# Patient Record
Sex: Female | Born: 1945 | Race: White | Hispanic: No | Marital: Married | Smoking: Never smoker
Health system: Southern US, Community
[De-identification: ages and names within clinical notes are randomized; demographics above are authoritative.]

## PROBLEM LIST (undated history)

## (undated) DIAGNOSIS — M1990 Unspecified osteoarthritis, unspecified site: Secondary | ICD-10-CM

## (undated) DIAGNOSIS — R7301 Impaired fasting glucose: Secondary | ICD-10-CM

## (undated) DIAGNOSIS — Z8619 Personal history of other infectious and parasitic diseases: Secondary | ICD-10-CM

## (undated) DIAGNOSIS — B059 Measles without complication: Secondary | ICD-10-CM

## (undated) DIAGNOSIS — M8580 Other specified disorders of bone density and structure, unspecified site: Secondary | ICD-10-CM

## (undated) DIAGNOSIS — H269 Unspecified cataract: Secondary | ICD-10-CM

## (undated) DIAGNOSIS — R011 Cardiac murmur, unspecified: Secondary | ICD-10-CM

## (undated) DIAGNOSIS — K5730 Diverticulosis of large intestine without perforation or abscess without bleeding: Secondary | ICD-10-CM

## (undated) DIAGNOSIS — Z8719 Personal history of other diseases of the digestive system: Secondary | ICD-10-CM

## (undated) DIAGNOSIS — Z8679 Personal history of other diseases of the circulatory system: Secondary | ICD-10-CM

## (undated) DIAGNOSIS — H35342 Macular cyst, hole, or pseudohole, left eye: Secondary | ICD-10-CM

## (undated) DIAGNOSIS — C801 Malignant (primary) neoplasm, unspecified: Secondary | ICD-10-CM

## (undated) DIAGNOSIS — E559 Vitamin D deficiency, unspecified: Secondary | ICD-10-CM

## (undated) DIAGNOSIS — K5792 Diverticulitis of intestine, part unspecified, without perforation or abscess without bleeding: Secondary | ICD-10-CM

## (undated) DIAGNOSIS — T7840XA Allergy, unspecified, initial encounter: Secondary | ICD-10-CM

## (undated) DIAGNOSIS — R351 Nocturia: Secondary | ICD-10-CM

## (undated) DIAGNOSIS — B269 Mumps without complication: Secondary | ICD-10-CM

## (undated) DIAGNOSIS — E785 Hyperlipidemia, unspecified: Secondary | ICD-10-CM

## (undated) HISTORY — DX: Cardiac murmur, unspecified: R01.1

## (undated) HISTORY — PX: REPLACEMENT TOTAL KNEE: SUR1224

## (undated) HISTORY — DX: Diverticulitis of intestine, part unspecified, without perforation or abscess without bleeding: K57.92

## (undated) HISTORY — PX: BREAST CYST ASPIRATION: SHX578

## (undated) HISTORY — DX: Diverticulosis of large intestine without perforation or abscess without bleeding: K57.30

## (undated) HISTORY — DX: Other specified disorders of bone density and structure, unspecified site: M85.80

## (undated) HISTORY — PX: EYE SURGERY: SHX253

## (undated) HISTORY — DX: Unspecified cataract: H26.9

## (undated) HISTORY — DX: Macular cyst, hole, or pseudohole, left eye: H35.342

## (undated) HISTORY — PX: CATARACT EXTRACTION: SUR2

## (undated) HISTORY — PX: APPENDECTOMY: SHX54

## (undated) HISTORY — PX: BREAST BIOPSY: SHX20

## (undated) HISTORY — DX: Impaired fasting glucose: R73.01

## (undated) HISTORY — DX: Personal history of other diseases of the digestive system: Z87.19

## (undated) HISTORY — DX: Allergy, unspecified, initial encounter: T78.40XA

## (undated) HISTORY — DX: Mumps without complication: B26.9

## (undated) HISTORY — PX: RIGHT OOPHORECTOMY: SHX2359

## (undated) HISTORY — DX: Personal history of other infectious and parasitic diseases: Z86.19

## (undated) HISTORY — DX: Nocturia: R35.1

## (undated) HISTORY — DX: Unspecified osteoarthritis, unspecified site: M19.90

## (undated) HISTORY — DX: Hyperlipidemia, unspecified: E78.5

## (undated) HISTORY — DX: Personal history of other diseases of the circulatory system: Z86.79

## (undated) HISTORY — DX: Vitamin D deficiency, unspecified: E55.9

## (undated) HISTORY — DX: Measles without complication: B05.9

---

## 2006-07-02 LAB — HM COLONOSCOPY: HM Colonoscopy: NORMAL

## 2008-07-02 HISTORY — PX: COLON SURGERY: SHX602

## 2013-11-30 LAB — HM MAMMOGRAPHY: HM MAMMO: NORMAL

## 2014-04-14 ENCOUNTER — Telehealth: Payer: Self-pay | Admitting: Family Medicine

## 2014-04-14 NOTE — Telephone Encounter (Signed)
Rec'd from [place] forward 102 pages to Dr.Byth

## 2014-04-16 ENCOUNTER — Telehealth: Payer: Self-pay | Admitting: *Deleted

## 2014-04-16 NOTE — Telephone Encounter (Signed)
Records received and forwarded to Dr. Charlett Blake. JG//CMA

## 2014-04-19 NOTE — Telephone Encounter (Signed)
Error

## 2014-07-02 HISTORY — PX: REPLACEMENT TOTAL KNEE: SUR1224

## 2014-08-02 ENCOUNTER — Ambulatory Visit: Payer: Medicare HMO | Attending: Orthopedic Surgery | Admitting: Rehabilitation

## 2014-08-02 DIAGNOSIS — Z96653 Presence of artificial knee joint, bilateral: Secondary | ICD-10-CM | POA: Diagnosis not present

## 2014-08-02 DIAGNOSIS — R262 Difficulty in walking, not elsewhere classified: Secondary | ICD-10-CM | POA: Diagnosis not present

## 2014-08-02 DIAGNOSIS — M25661 Stiffness of right knee, not elsewhere classified: Secondary | ICD-10-CM | POA: Diagnosis not present

## 2014-08-02 DIAGNOSIS — M25561 Pain in right knee: Secondary | ICD-10-CM | POA: Insufficient documentation

## 2014-08-04 ENCOUNTER — Ambulatory Visit: Payer: Medicare HMO | Admitting: Rehabilitation

## 2014-08-06 ENCOUNTER — Ambulatory Visit: Payer: Medicare HMO | Admitting: Rehabilitation

## 2014-08-09 ENCOUNTER — Ambulatory Visit: Payer: Medicare HMO | Admitting: Rehabilitation

## 2014-08-11 ENCOUNTER — Ambulatory Visit: Payer: Medicare HMO | Admitting: Rehabilitation

## 2014-08-11 DIAGNOSIS — M25561 Pain in right knee: Secondary | ICD-10-CM | POA: Diagnosis not present

## 2014-08-12 ENCOUNTER — Ambulatory Visit: Payer: Medicare HMO | Admitting: Rehabilitation

## 2014-08-12 DIAGNOSIS — M25561 Pain in right knee: Secondary | ICD-10-CM | POA: Diagnosis not present

## 2014-08-16 ENCOUNTER — Ambulatory Visit: Payer: Medicare HMO | Admitting: Rehabilitation

## 2014-08-19 ENCOUNTER — Ambulatory Visit: Payer: Medicare HMO | Admitting: Rehabilitation

## 2014-08-23 ENCOUNTER — Ambulatory Visit: Payer: Medicare HMO | Admitting: Rehabilitation

## 2014-08-26 ENCOUNTER — Ambulatory Visit: Payer: Self-pay | Admitting: Family Medicine

## 2014-08-26 ENCOUNTER — Ambulatory Visit: Payer: Medicare HMO | Admitting: Rehabilitation

## 2014-12-08 ENCOUNTER — Telehealth: Payer: Self-pay | Admitting: *Deleted

## 2014-12-08 ENCOUNTER — Encounter: Payer: Self-pay | Admitting: *Deleted

## 2014-12-08 NOTE — Telephone Encounter (Signed)
Pre-Visit Call completed with patient and chart updated.   Pre-Visit Info documented in Specialty Comments under [employer].    

## 2014-12-09 ENCOUNTER — Encounter: Payer: Self-pay | Admitting: Family Medicine

## 2014-12-09 ENCOUNTER — Ambulatory Visit (INDEPENDENT_AMBULATORY_CARE_PROVIDER_SITE_OTHER): Payer: Medicare HMO | Admitting: Family Medicine

## 2014-12-09 VITALS — BP 102/70 | HR 86 | Temp 97.8°F | Ht 62.0 in | Wt 129.2 lb

## 2014-12-09 DIAGNOSIS — K5730 Diverticulosis of large intestine without perforation or abscess without bleeding: Secondary | ICD-10-CM | POA: Diagnosis not present

## 2014-12-09 DIAGNOSIS — R7303 Prediabetes: Secondary | ICD-10-CM | POA: Insufficient documentation

## 2014-12-09 DIAGNOSIS — E559 Vitamin D deficiency, unspecified: Secondary | ICD-10-CM

## 2014-12-09 DIAGNOSIS — M1990 Unspecified osteoarthritis, unspecified site: Secondary | ICD-10-CM

## 2014-12-09 DIAGNOSIS — Z8619 Personal history of other infectious and parasitic diseases: Secondary | ICD-10-CM | POA: Insufficient documentation

## 2014-12-09 DIAGNOSIS — Z0000 Encounter for general adult medical examination without abnormal findings: Secondary | ICD-10-CM

## 2014-12-09 DIAGNOSIS — H35342 Macular cyst, hole, or pseudohole, left eye: Secondary | ICD-10-CM

## 2014-12-09 DIAGNOSIS — T7840XA Allergy, unspecified, initial encounter: Secondary | ICD-10-CM | POA: Insufficient documentation

## 2014-12-09 DIAGNOSIS — E785 Hyperlipidemia, unspecified: Secondary | ICD-10-CM

## 2014-12-09 DIAGNOSIS — R7301 Impaired fasting glucose: Secondary | ICD-10-CM

## 2014-12-09 DIAGNOSIS — R351 Nocturia: Secondary | ICD-10-CM | POA: Diagnosis not present

## 2014-12-09 DIAGNOSIS — Z8719 Personal history of other diseases of the digestive system: Secondary | ICD-10-CM

## 2014-12-09 HISTORY — DX: Impaired fasting glucose: R73.01

## 2014-12-09 HISTORY — DX: Personal history of other diseases of the digestive system: Z87.19

## 2014-12-09 HISTORY — DX: Diverticulosis of large intestine without perforation or abscess without bleeding: K57.30

## 2014-12-09 HISTORY — DX: Unspecified osteoarthritis, unspecified site: M19.90

## 2014-12-09 HISTORY — DX: Nocturia: R35.1

## 2014-12-09 NOTE — Progress Notes (Signed)
Pre visit review using our clinic review tool, if applicable. No additional management support is needed unless otherwise documented below in the visit note. 

## 2014-12-09 NOTE — Progress Notes (Signed)
Moraima Burd  [id number] 1945/09/18 12/09/2014      Progress Note-Follow Up  Subjective  Chief Complaint  Chief Complaint  Patient presents with  . Establish Care    HPI  Patient is a 69 y.o. female in today for routine medical care. Patient is in today to establish care. Her previous PMD has left practice. She has a past medical history that includes hyperlipidemia, vitamin D deficiency, macular disease, diverticulitis. She also has osteoarthritis and has had 2 knees replaced now. Her most recent knee replacement was 5 months ago on the right. It was performed by Dr. Richard Miu and she is healing well. Still struggles with some pain but gets relief with tramadol as needed. Denies CP/palp/SOB/HA/congestion/fevers/GI or GU c/o. Taking meds as prescribed  Past Medical History  Diagnosis Date  . Cataract     Left eye  . Diverticulitis     Past Surgical History  Procedure Laterality Date  . Joint replacement      07/2014 RT Total Knee Replacement; 2015, LKR  . Eye surgery      Left eye  . Colon surgery  2010    pt. reports intestines burst    Family History  Problem Relation Age of Onset  . Heart disease Father 86  . Thyroid disease Mother   . Diabetes Mother     developed in her 31's    History   Social History  . Marital Status: Married    Spouse Name: N/A  . Number of Children: N/A  . Years of Education: 16   Occupational History  . Web designer at [employer]  . Smoking status: Never Smoker   . Smokeless tobacco: Not on file  . Alcohol Use: 0.0 oz/week    0 Standard drinks or equivalent per week     Comment: occassional  . Drug Use: No  . Sexual Activity: Not on file   Other Topics Concern  . Not on file   Social History Narrative    Current Outpatient Prescriptions on File Prior to Visit  Medication Sig Dispense Refill  . Acetaminophen (TYLENOL EXTRA STRENGTH PO) Take by mouth as needed.    . Biotin 1000  MCG tablet Take 1,000 mcg by mouth daily.    . Multiple Vitamins-Minerals (MULTIVITAMIN ADULT PO) Take 1 tablet by mouth daily.    Marland Kitchen VITAMIN D, CHOLECALCIFEROL, PO Take 200 Units by mouth daily. Pt. Reported    . TRAMADOL HCL PO Take by mouth as needed.     No current facility-administered medications on file prior to visit.    No Known Allergies  Review of Systems  Review of Systems  Constitutional: Negative for fever and malaise/fatigue.  HENT: Negative for congestion.   Eyes: Negative for discharge.  Respiratory: Negative for shortness of breath.   Cardiovascular: Negative for chest pain, palpitations and leg swelling.  Gastrointestinal: Negative for nausea, abdominal pain and diarrhea.  Genitourinary: Negative for dysuria.  Musculoskeletal: Positive for joint pain. Negative for falls.  Skin: Negative for rash.  Neurological: Negative for loss of consciousness and headaches.  Endo/Heme/Allergies: Negative for polydipsia.  Psychiatric/Behavioral: Negative for depression and suicidal ideas. The patient is not nervous/anxious and does not have insomnia.     Objective  BP 102/70 mmHg  Pulse 86  Temp(Src) 97.8 F (36.6 C) (Oral)  Ht 5\' 2"  (1.575 m)  Wt 129 lb 4 oz (58.627 kg)  BMI 23.63 kg/m2  SpO2 97%  Physical Exam  Physical Exam  Constitutional: She is oriented to person, place, and time and well-developed, well-nourished, and in no distress. No distress.  HENT:  Head: Normocephalic and atraumatic.  Eyes: Conjunctivae are normal.  Neck: Neck supple. No thyromegaly present.  Cardiovascular: Normal rate, regular rhythm and normal heart sounds.   No murmur heard. Pulmonary/Chest: Effort normal and breath sounds normal. She has no wheezes.  Abdominal: She exhibits no distension and no mass.  Musculoskeletal: She exhibits no edema.  Lymphadenopathy:    She has no cervical adenopathy.  Neurological: She is alert and oriented to person, place, and time.  Skin: Skin is  warm and dry. No rash noted. She is not diaphoretic.  Psychiatric: Memory, affect and judgment normal.    Assessment & Plan  Osteoarthritis S/p b/l TKR managing with infrequent pain meds.  Vitamin D deficiency Continue supplements at 2000 to 5000 IU daily, check level with next blood draw  Hyperlipidemia Encouraged heart healthy diet, increase exercise, avoid trans fats, consider a krill oil cap daily  H/O diverticulitis of colon Encouraged daily probiotics, 64 oz of clear fluids, add a fiber supplements  Nocturia No sign of infection, minimize fluids 1-2 hours prior to qhs. Trace blood noted. Will repeat UA and monitor  Macular hole of left eye Follows with [facility], no reent new concerns.

## 2014-12-09 NOTE — Patient Instructions (Signed)
Probiotics daily [employer] for Adults A healthy lifestyle and preventive care can promote health and wellness. Preventive health guidelines for women include the following key practices.  A routine yearly physical is a good way to check with your health care provider about your health and preventive screening. It is a chance to share any concerns and updates on your health and to receive a thorough exam.  Visit your dentist for a routine exam and preventive care every 6 months. Brush your teeth twice a day and floss once a day. Good oral hygiene prevents tooth decay and gum disease.  The frequency of eye exams is based on your age, health, family medical hist    ory, use of contact lenses, and other factors. Follow your health care provider's recommendations for frequency of eye exams.  Eat a healthy diet. Foods like vegetables, fruits, whole grains, low-fat dairy products, and lean protein foods contain the nutrients you need without too many calories. Decrease your intake of foods high in solid fats, added sugars, and salt. Eat the right amount of calories for you.Get information about a proper diet from your health care provider, if necessary.  Regular physical exercise is one of the most important things you can do for your health. Most adults should get at least 150 minutes of moderate-intensity exercise (any activity that increases your heart rate and causes you to sweat) each week. In addition, most adults need muscle-strengthening exercises on 2 or more days a week.  Maintain a healthy weight. The body mass index (BMI) is a screening tool to identify possible weight problems. It provides an estimate of body fat based on height and weight. Your health care provider can find your BMI and can help you achieve or maintain a healthy weight.For adults 20 years and older:  A BMI below 18.5 is considered underweight.  A BMI of 18.5 to 24.9  is normal.  A BMI of 25 to 29.9 is considered overweight.  A BMI of 30 and above is considered obese.  Maintain normal blood lipids and cholesterol levels by exercising and minimizing your intake of saturated fat. Eat a balanced diet with plenty of fruit and vegetables. Blood tests for lipids and cholesterol should begin at age 108 and be repeated every 5 years. If your lipid or cholesterol levels are high, you are over 50, or you are at high risk for heart disease, you may need your cholesterol levels checked more frequently.Ongoing high lipid and cholesterol levels should be treated with medicines if diet and exercise are not working.  If you smoke, find out from your health care provider how to quit. If you do not use tobacco, do not start.  Lung cancer screening is recommended for adults aged 42-80 years who are at high risk for developing lung cancer because of a history of smoking. A yearly low-dose CT scan of the lungs is recommended for people who have at least a 30-pack-year history of smoking and are a current smoker or have quit within the past 15 years. A pack year of smoking is smoking an average of 1 pack of cigarettes a day for 1 year (for example: 1 pack a day for 30 years or 2 packs a day for 15 years). Yearly screening should continue until the smoker has stopped smoking for at least 15 years. Yearly screening should be stopped for people who develop a health problem that would prevent them from having lung cancer treatment.  If you are pregnant, do not drink alcohol. If you are breastfeeding, be very cautious about drinking alcohol. If you are not pregnant and choose to drink alcohol, do not have more than 1 drink per day. One drink is considered to be 12 ounces (355 mL) of beer, 5 ounces (148 mL) of wine, or 1.5 ounces (44 mL) of liquor.  Avoid use of street drugs. Do not share needles with anyone. Ask for help if you need support or instructions about stopping the use of  drugs.  High blood pressure causes heart disease and increases the risk of stroke. Your blood pressure should be checked at least every 1 to 2 years. Ongoing high blood pressure should be treated with medicines if weight loss and exercise do not work.  If you are 55-79 years old, ask your health care provider if you should take aspirin to prevent strokes.  Diabetes screening involves taking a blood sample to check your fasting blood sugar level. This should be done once every 3 years, after age 45, if you are within normal weight and without risk factors for diabetes. Testing should be considered at a younger age or be carried out more frequently if you are overweight and have at least 1 risk factor for diabetes.  Breast cancer screening is essential preventive care for women. You should practice "breast self-awareness." This means understanding the normal appearance and feel of your breasts and may include breast self-examination. Any changes detected, no matter how small, should be reported to a health care provider. Women in their 20s and 30s should have a clinical breast exam (CBE) by a health care provider as part of a regular health exam every 1 to 3 years. After age 40, women should have a CBE every year. Starting at age 40, women should consider having a mammogram (breast X-ray test) every year. Women who have a family history of breast cancer should talk to their health care provider about genetic screening. Women at a high risk of breast cancer should talk to their health care providers about having an MRI and a mammogram every year.  Breast cancer gene (BRCA)-related cancer risk assessment is recommended for women who have family members with BRCA-related cancers. BRCA-related cancers include breast, ovarian, tubal, and peritoneal cancers. Having family members with these cancers may be associated with an increased risk for harmful changes (mutations) in the breast cancer genes BRCA1 and BRCA2.  Results of the assessment will determine the need for genetic counseling and BRCA1 and BRCA2 testing.  Routine pelvic exams to screen for cancer are no longer recommended for nonpregnant women who are considered low risk for cancer of the pelvic organs (ovaries, uterus, and vagina) and who do not have symptoms. Ask your health care provider if a screening pelvic exam is right for you.  If you have had past treatment for cervical cancer or a condition that could lead to cancer, you need Pap tests and screening for cancer for at least 20 years after your treatment. If Pap tests have been discontinued, your risk factors (such as having a new sexual partner) need to be reassessed to determine if screening should be resumed. Some women have medical problems that increase the chance of getting cervical cancer. In these cases, your health care provider may recommend more frequent screening and Pap tests.  The HPV test is an additional test that may be used for cervical cancer screening. The HPV test looks for the virus that can cause the cell changes   on the cervix. The cells collected during the Pap test can be tested for HPV. The HPV test could be used to screen women aged 30 years and older, and should be used in women of any age who have unclear Pap test results. After the age of 30, women should have HPV testing at the same frequency as a Pap test.  Colorectal cancer can be detected and often prevented. Most routine colorectal cancer screening begins at the age of 50 years and continues through age 75 years. However, your health care provider may recommend screening at an earlier age if you have risk factors for colon cancer. On a yearly basis, your health care provider may provide home test kits to check for hidden blood in the stool. Use of a small camera at the end of a tube, to directly examine the colon (sigmoidoscopy or colonoscopy), can detect the earliest forms of colorectal cancer. Talk to your health  care provider about this at age 50, when routine screening begins. Direct exam of the colon should be repeated every 5-10 years through age 75 years, unless early forms of pre-cancerous polyps or small growths are found.  People who are at an increased risk for hepatitis B should be screened for this virus. You are considered at high risk for hepatitis B if:  You were born in a country where hepatitis B occurs often. Talk with your health care provider about which countries are considered high risk.  Your parents were born in a high-risk country and you have not received a shot to protect against hepatitis B (hepatitis B vaccine).  You have HIV or AIDS.  You use needles to inject street drugs.  You live with, or have sex with, someone who has hepatitis B.  You get hemodialysis treatment.  You take certain medicines for conditions like cancer, organ transplantation, and autoimmune conditions.  Hepatitis C blood testing is recommended for all people born from 1945 through 1965 and any individual with known risks for hepatitis C.  Practice safe sex. Use condoms and avoid high-risk sexual practices to reduce the spread of sexually transmitted infections (STIs). STIs include gonorrhea, chlamydia, syphilis, trichomonas, herpes, HPV, and human immunodeficiency virus (HIV). Herpes, HIV, and HPV are viral illnesses that have no cure. They can result in disability, cancer, and death.  You should be screened for sexually transmitted illnesses (STIs) including gonorrhea and chlamydia if:  You are sexually active and are younger than 24 years.  You are older than 24 years and your health care provider tells you that you are at risk for this type of infection.  Your sexual activity has changed since you were last screened and you are at an increased risk for chlamydia or gonorrhea. Ask your health care provider if you are at risk.  If you are at risk of being infected with HIV, it is recommended  that you take a prescription medicine daily to prevent HIV infection. This is called preexposure prophylaxis (PrEP). You are considered at risk if:  You are a heterosexual woman, are sexually active, and are at increased risk for HIV infection.  You take drugs by injection.  You are sexually active with a partner who has HIV.  Talk with your health care provider about whether you are at high risk of being infected with HIV. If you choose to begin PrEP, you should first be tested for HIV. You should then be tested every 3 months for as long as you are taking PrEP.    Osteoporosis is a disease in which the bones lose minerals and strength with aging. This can result in serious bone fractures or breaks. The risk of osteoporosis can be identified using a bone density scan. Women ages 23 years and over and women at risk for fractures or osteoporosis should discuss screening with their health care providers. Ask your health care provider whether you should take a calcium supplement or vitamin D to reduce the rate of osteoporosis.  Menopause can be associated with physical symptoms and risks. Hormone replacement therapy is available to decrease symptoms and risks. You should talk to your health care provider about whether hormone replacement therapy is right for you.  Use sunscreen. Apply sunscreen liberally and repeatedly throughout the day. You should seek shade when your shadow is shorter than you. Protect yourself by wearing long sleeves, pants, a wide-brimmed hat, and sunglasses year round, whenever you are outdoors.  Once a month, do a whole body skin exam, using a mirror to look at the skin on your back. Tell your health care provider of new moles, moles that have irregular borders, moles that are larger than a pencil eraser, or moles that have changed in shape or color.  Stay current with required vaccines (immunizations).  Influenza vaccine. All adults should be immunized every year.  Tetanus,  diphtheria, and acellular pertussis (Td, Tdap) vaccine. Pregnant women should receive 1 dose of Tdap vaccine during each pregnancy. The dose should be obtained regardless of the length of time since the last dose. Immunization is preferred during the 27th-36th week of gestation. An adult who has not previously received Tdap or who does not know her vaccine status should receive 1 dose of Tdap. This initial dose should be followed by tetanus and diphtheria toxoids (Td) booster doses every 10 years. Adults with an unknown or incomplete history of completing a 3-dose immunization series with Td-containing vaccines should begin or complete a primary immunization series including a Tdap dose. Adults should receive a Td booster every 10 years.  Varicella vaccine. An adult without evidence of immunity to varicella should receive 2 doses or a second dose if she has previously received 1 dose. Pregnant females who do not have evidence of immunity should receive the first dose after pregnancy. This first dose should be obtained before leaving the health care facility. The second dose should be obtained 4-8 weeks after the first dose.  Human papillomavirus (HPV) vaccine. Females aged 13-26 years who have not received the vaccine previously should obtain the 3-dose series. The vaccine is not recommended for use in pregnant females. However, pregnancy testing is not needed before receiving a dose. If a female is found to be pregnant after receiving a dose, no treatment is needed. In that case, the remaining doses should be delayed until after the pregnancy. Immunization is recommended for any person with an immunocompromised condition through the age of 34 years if she did not get any or all doses earlier. During the 3-dose series, the second dose should be obtained 4-8 weeks after the first dose. The third dose should be obtained 24 weeks after the first dose and 16 weeks after the second dose.  Zoster vaccine. One dose  is recommended for adults aged 49 years or older unless certain conditions are present.  Measles, mumps, and rubella (MMR) vaccine. Adults born before [birth year] generally are considered immune to measles and mumps. Adults born in [birth year] or later should have 1 or more doses of MMR vaccine unless there is a  contraindication to the vaccine or there is laboratory evidence of immunity to each of the three diseases. A routine second dose of MMR vaccine should be obtained at least 28 days after the first dose for students attending postsecondary schools, health care workers, or international travelers. People who received inactivated measles vaccine or an unknown type of measles vaccine during 1963-1967 should receive 2 doses of MMR vaccine. People who received inactivated mumps vaccine or an unknown type of mumps vaccine before 1979 and are at high risk for mumps infection should consider immunization with 2 doses of MMR vaccine. For females of childbearing age, rubella immunity should be determined. If there is no evidence of immunity, females who are not pregnant should be vaccinated. If there is no evidence of immunity, females who are pregnant should delay immunization until after pregnancy. Unvaccinated health care workers born before [birth year] who lack laboratory evidence of measles, mumps, or rubella immunity or laboratory confirmation of disease should consider measles and mumps immunization with 2 doses of MMR vaccine or rubella immunization with 1 dose of MMR vaccine.  Pneumococcal 13-valent conjugate (PCV13) vaccine. When indicated, a person who is uncertain of her immunization history and has no record of immunization should receive the PCV13 vaccine. An adult aged 72 years or older who has certain medical conditions and has not been previously immunized should receive 1 dose of PCV13 vaccine. This PCV13 should be followed with a dose of pneumococcal polysaccharide (PPSV23) vaccine. The PPSV23 vaccine dose should be  obtained at least 8 weeks after the dose of PCV13 vaccine. An adult aged 33 years or older who has certain medical conditions and previously received 1 or more doses of PPSV23 vaccine should receive 1 dose of PCV13. The PCV13 vaccine dose should be obtained 1 or more years after the last PPSV23 vaccine dose.  Pneumococcal polysaccharide (PPSV23) vaccine. When PCV13 is also indicated, PCV13 should be obtained first. All adults aged 50 years and older should be immunized. An adult younger than age 14 years who has certain medical conditions should be immunized. Any person who resides in a nursing home or long-term care facility should be immunized. An adult smoker should be immunized. People with an immunocompromised condition and certain other conditions should receive both PCV13 and PPSV23 vaccines. People with human immunodeficiency virus (HIV) infection should be immunized as soon as possible after diagnosis. Immunization during chemotherapy or radiation therapy should be avoided. Routine use of PPSV23 vaccine is not recommended for American Indians, [place]atives, or people younger than 65 years unless there are medical conditions that require PPSV23 vaccine. When indicated, people who have unknown immunization and have no record of immunization should receive PPSV23 vaccine. One-time revaccination 5 years after the first dose of PPSV23 is recommended for people aged 19-64 years who have chronic kidney failure, nephrotic syndrome, asplenia, or immunocompromised conditions. People who received 1-2 doses of PPSV23 before age 39 years should receive another dose of PPSV23 vaccine at age 43 years or later if at least 5 years have passed since the previous dose. Doses of PPSV23 are not needed for people immunized with PPSV23 at or after age 74 years.  Meningococcal vaccine. Adults with asplenia or persistent complement component deficiencies should receive 2 doses of quadrivalent meningococcal conjugate  (MenACWY-D) vaccine. The doses should be obtained at least 2 months apart. Microbiologists working with certain meningococcal bacteria, [employer] recruits, people at risk during an outbreak, and people who travel to or live in countries with a high rate  of meningitis should be immunized. A first-year college student up through age 21 years who is living in a residence hall should receive a dose if she did not receive a dose on or after her 16th birthday. Adults who have certain high-risk conditions should receive one or more doses of vaccine.  Hepatitis A vaccine. Adults who wish to be protected from this disease, have certain high-risk conditions, work with hepatitis A-infected animals, work in hepatitis A research labs, or travel to or work in countries with a high rate of hepatitis A should be immunized. Adults who were previously unvaccinated and who anticipate close contact with an international adoptee during the first 60 days after arrival in the [place] from a country with a high rate of hepatitis A should be immunized.  Hepatitis B vaccine. Adults who wish to be protected from this disease, have certain high-risk conditions, may be exposed to blood or other infectious body fluids, are household contacts or sex partners of hepatitis B positive people, are clients or workers in certain care facilities, or travel to or work in countries with a high rate of hepatitis B should be immunized.  Haemophilus influenzae type b (Hib) vaccine. A previously unvaccinated person with asplenia or sickle cell disease or having a scheduled splenectomy should receive 1 dose of Hib vaccine. Regardless of previous immunization, a recipient of a hematopoietic stem cell transplant should receive a 3-dose series 6-12 months after her successful transplant. Hib vaccine is not recommended for adults with HIV infection. Preventive Services / Frequency Ages 19 to 39 years  Blood pressure check.** / Every 1 to 2  years.  Lipid and cholesterol check.** / Every 5 years beginning at age 20.  Clinical breast exam.** / Every 3 years for women in their 20s and 30s.  BRCA-related cancer risk assessment.** / For women who have family members with a BRCA-related cancer (breast, ovarian, tubal, or peritoneal cancers).  Pap test.** / Every 2 years from ages 21 through 29. Every 3 years starting at age 30 through age 65 or 70 with a history of 3 consecutive normal Pap tests.  HPV screening.** / Every 3 years from ages 30 through ages 65 to 70 with a history of 3 consecutive normal Pap tests.  Hepatitis C blood test.** / For any individual with known risks for hepatitis C.  Skin self-exam. / Monthly.  Influenza vaccine. / Every year.  Tetanus, diphtheria, and acellular pertussis (Tdap, Td) vaccine.** / Consult your health care provider. Pregnant women should receive 1 dose of Tdap vaccine during each pregnancy. 1 dose of Td every 10 years.  Varicella vaccine.** / Consult your health care provider. Pregnant females who do not have evidence of immunity should receive the first dose after pregnancy.  HPV vaccine. / 3 doses over 6 months, if 26 and younger. The vaccine is not recommended for use in pregnant females. However, pregnancy testing is not needed before receiving a dose.  Measles, mumps, rubella (MMR) vaccine.** / You need at least 1 dose of MMR if you were born in 1957 or later. You may also need a 2nd dose. For females of childbearing age, rubella immunity should be determined. If there is no evidence of immunity, females who are not pregnant should be vaccinated. If there is no evidence of immunity, females who are pregnant should delay immunization until after pregnancy.  Pneumococcal 13-valent conjugate (PCV13) vaccine.** / Consult your health care provider.  Pneumococcal polysaccharide (PPSV23) vaccine.** / 1 to 2 doses if   you smoke cigarettes or if you have certain conditions.  Meningococcal  vaccine.** / 1 dose if you are age 19 to 21 years and a first-year college student living in a residence hall, or have one of several medical conditions, you need to get vaccinated against meningococcal disease. You may also need additional booster doses.  Hepatitis A vaccine.** / Consult your health care provider.  Hepatitis B vaccine.** / Consult your health care provider.  Haemophilus influenzae type b (Hib) vaccine.** / Consult your health care provider. Ages 40 to 64 years  Blood pressure check.** / Every 1 to 2 years.  Lipid and cholesterol check.** / Every 5 years beginning at age 20 years.  Lung cancer screening. / Every year if you are aged 55-80 years and have a 30-pack-year history of smoking and currently smoke or have quit within the past 15 years. Yearly screening is stopped once you have quit smoking for at least 15 years or develop a health problem that would prevent you from having lung cancer treatment.  Clinical breast exam.** / Every year after age 40 years.  BRCA-related cancer risk assessment.** / For women who have family members with a BRCA-related cancer (breast, ovarian, tubal, or peritoneal cancers).  Mammogram.** / Every year beginning at age 40 years and continuing for as long as you are in good health. Consult with your health care provider.  Pap test.** / Every 3 years starting at age 30 years through age 65 or 70 years with a history of 3 consecutive normal Pap tests.  HPV screening.** / Every 3 years from ages 30 years through ages 65 to 70 years with a history of 3 consecutive normal Pap tests.  Fecal occult blood test (FOBT) of stool. / Every year beginning at age 50 years and continuing until age 75 years. You may not need to do this test if you get a colonoscopy every 10 years.  Flexible sigmoidoscopy or colonoscopy.** / Every 5 years for a flexible sigmoidoscopy or every 10 years for a colonoscopy beginning at age 50 years and continuing until age 75  years.  Hepatitis C blood test.** / For all people born from 1945 through 1965 and any individual with known risks for hepatitis C.  Skin self-exam. / Monthly.  Influenza vaccine. / Every year.  Tetanus, diphtheria, and acellular pertussis (Tdap/Td) vaccine.** / Consult your health care provider. Pregnant women should receive 1 dose of Tdap vaccine during each pregnancy. 1 dose of Td every 10 years.  Varicella vaccine.** / Consult your health care provider. Pregnant females who do not have evidence of immunity should receive the first dose after pregnancy.  Zoster vaccine.** / 1 dose for adults aged 60 years or older.  Measles, mumps, rubella (MMR) vaccine.** / You need at least 1 dose of MMR if you were born in 1957 or later. You may also need a 2nd dose. For females of childbearing age, rubella immunity should be determined. If there is no evidence of immunity, females who are not pregnant should be vaccinated. If there is no evidence of immunity, females who are pregnant should delay immunization until after pregnancy.  Pneumococcal 13-valent conjugate (PCV13) vaccine.** / Consult your health care provider.  Pneumococcal polysaccharide (PPSV23) vaccine.** / 1 to 2 doses if you smoke cigarettes or if you have certain conditions.  Meningococcal vaccine.** / Consult your health care provider.  Hepatitis A vaccine.** / Consult your health care provider.  Hepatitis B vaccine.** / Consult your health care provider.  Haemophilus   influenzae type b (Hib) vaccine.** / Consult your health care provider. Ages 65 years and over  Blood pressure check.** / Every 1 to 2 years.  Lipid and cholesterol check.** / Every 5 years beginning at age 20 years.  Lung cancer screening. / Every year if you are aged 55-80 years and have a 30-pack-year history of smoking and currently smoke or have quit within the past 15 years. Yearly screening is stopped once you have quit smoking for at least 15 years or  develop a health problem that would prevent you from having lung cancer treatment.  Clinical breast exam.** / Every year after age 40 years.  BRCA-related cancer risk assessment.** / For women who have family members with a BRCA-related cancer (breast, ovarian, tubal, or peritoneal cancers).  Mammogram.** / Every year beginning at age 40 years and continuing for as long as you are in good health. Consult with your health care provider.  Pap test.** / Every 3 years starting at age 30 years through age 65 or 70 years with 3 consecutive normal Pap tests. Testing can be stopped between 65 and 70 years with 3 consecutive normal Pap tests and no abnormal Pap or HPV tests in the past 10 years.  HPV screening.** / Every 3 years from ages 30 years through ages 65 or 70 years with a history of 3 consecutive normal Pap tests. Testing can be stopped between 65 and 70 years with 3 consecutive normal Pap tests and no abnormal Pap or HPV tests in the past 10 years.  Fecal occult blood test (FOBT) of stool. / Every year beginning at age 50 years and continuing until age 75 years. You may not need to do this test if you get a colonoscopy every 10 years.  Flexible sigmoidoscopy or colonoscopy.** / Every 5 years for a flexible sigmoidoscopy or every 10 years for a colonoscopy beginning at age 50 years and continuing until age 75 years.  Hepatitis C blood test.** / For all people born from 1945 through 1965 and any individual with known risks for hepatitis C.  Osteoporosis screening.** / A one-time screening for women ages 65 years and over and women at risk for fractures or osteoporosis.  Skin self-exam. / Monthly.  Influenza vaccine. / Every year.  Tetanus, diphtheria, and acellular pertussis (Tdap/Td) vaccine.** / 1 dose of Td every 10 years.  Varicella vaccine.** / Consult your health care provider.  Zoster vaccine.** / 1 dose for adults aged 60 years or older.  Pneumococcal 13-valent conjugate  (PCV13) vaccine.** / Consult your health care provider.  Pneumococcal polysaccharide (PPSV23) vaccine.** / 1 dose for all adults aged 65 years and older.  Meningococcal vaccine.** / Consult your health care provider.  Hepatitis A vaccine.** / Consult your health care provider.  Hepatitis B vaccine.** / Consult your health care provider.  Haemophilus influenzae type b (Hib) vaccine.** / Consult your health care provider. ** Family history and personal history of risk and conditions may change your health care provider's recommendations. Document Released: 08/14/2001 Document Revised: 11/02/2013 Document Reviewed: 11/13/2010 [employer] Patient Information 2015 [employer]. This information is not intended to replace advice given to you by your health care provider. Make sure you discuss any questions you have with your health care provider.  

## 2014-12-10 LAB — URINALYSIS
BILIRUBIN URINE: NEGATIVE
KETONES UR: NEGATIVE
LEUKOCYTES UA: NEGATIVE
Nitrite: NEGATIVE
SPECIFIC GRAVITY, URINE: 1.01 (ref 1.000–1.030)
TOTAL PROTEIN, URINE-UPE24: NEGATIVE
URINE GLUCOSE: NEGATIVE
Urobilinogen, UA: 0.2 (ref 0.0–1.0)
pH: 5.5 (ref 5.0–8.0)

## 2014-12-11 LAB — URINE CULTURE
Colony Count: NO GROWTH
ORGANISM ID, BACTERIA: NO GROWTH

## 2014-12-13 ENCOUNTER — Other Ambulatory Visit: Payer: Self-pay | Admitting: Family Medicine

## 2014-12-13 ENCOUNTER — Encounter: Payer: Self-pay | Admitting: Family Medicine

## 2014-12-13 DIAGNOSIS — R319 Hematuria, unspecified: Secondary | ICD-10-CM

## 2014-12-19 NOTE — Assessment & Plan Note (Signed)
No sign of infection, minimize fluids 1-2 hours prior to qhs. Trace blood noted. Will repeat UA and monitor

## 2014-12-19 NOTE — Assessment & Plan Note (Signed)
Encouraged heart healthy diet, increase exercise, avoid trans fats, consider a krill oil cap daily 

## 2014-12-19 NOTE — Assessment & Plan Note (Signed)
Continue supplements at 2000 to 5000 IU daily, check level with next blood draw

## 2014-12-19 NOTE — Assessment & Plan Note (Signed)
S/p b/l TKR managing with infrequent pain meds.

## 2014-12-19 NOTE — Assessment & Plan Note (Signed)
Follows with [facility], no reent new concerns.

## 2014-12-19 NOTE — Assessment & Plan Note (Signed)
Encouraged daily probiotics, 64 oz of clear fluids, add a fiber supplements

## 2015-01-07 ENCOUNTER — Other Ambulatory Visit: Payer: Medicare HMO

## 2015-01-07 ENCOUNTER — Other Ambulatory Visit (INDEPENDENT_AMBULATORY_CARE_PROVIDER_SITE_OTHER): Payer: Medicare HMO

## 2015-01-07 DIAGNOSIS — R319 Hematuria, unspecified: Secondary | ICD-10-CM

## 2015-01-07 DIAGNOSIS — N390 Urinary tract infection, site not specified: Secondary | ICD-10-CM

## 2015-01-07 LAB — URINALYSIS, ROUTINE W REFLEX MICROSCOPIC
BILIRUBIN URINE: NEGATIVE
Ketones, ur: NEGATIVE
Leukocytes, UA: NEGATIVE
NITRITE: NEGATIVE
RBC / HPF: NONE SEEN (ref 0–?)
Specific Gravity, Urine: 1.01 (ref 1.000–1.030)
TOTAL PROTEIN, URINE-UPE24: NEGATIVE
URINE GLUCOSE: NEGATIVE
UROBILINOGEN UA: 0.2 (ref 0.0–1.0)
pH: 6 (ref 5.0–8.0)

## 2015-01-09 LAB — URINE CULTURE
Colony Count: NO GROWTH
Organism ID, Bacteria: NO GROWTH

## 2015-01-11 ENCOUNTER — Other Ambulatory Visit: Payer: Medicare HMO

## 2015-03-18 ENCOUNTER — Telehealth: Payer: Self-pay | Admitting: Family Medicine

## 2015-03-18 NOTE — Telephone Encounter (Signed)
Pt was scheduled for 03/25/15 for 15 minute follow up. Pt called in to confirm appt and stated it was for a physical. Per AVS 12/09/14 pt was to be scheduled for annual wellness visit. Pt does not want to come in if just seen for a follow up. Will you be able to see for AWV or do I need to reschedule her for another time? If reschedule where can I fit her in?

## 2015-03-18 NOTE — Telephone Encounter (Signed)
Could move her to 11 and take those two spaces. Leave the spot she vacated blocked for now for an urgent visit.

## 2015-03-21 ENCOUNTER — Ambulatory Visit: Payer: Medicare HMO | Admitting: Family Medicine

## 2015-03-21 NOTE — Telephone Encounter (Signed)
The 11:15 is booked for a hosp f/u already.

## 2015-03-21 NOTE — Telephone Encounter (Signed)
Would she allow the RN to do the AWV and I can do a follow up for medical concerns. If not if I have a hospital f/u already she will get short changed on her time. If she is willing to come in early I can meet her in here one morning around 7:15

## 2015-03-22 NOTE — Telephone Encounter (Signed)
Noted! Thank you

## 2015-03-22 NOTE — Telephone Encounter (Signed)
Pt will see Dr. Charlett Blake 1:15pm Friday 03/25/15 I scheduled with Ashlee 1:45pm Friday 03/25/15 for Wellness portion Pt would like labs same day if possible

## 2015-03-25 ENCOUNTER — Ambulatory Visit: Payer: Medicare HMO

## 2015-03-25 ENCOUNTER — Encounter: Payer: Self-pay | Admitting: Family Medicine

## 2015-03-25 ENCOUNTER — Ambulatory Visit (INDEPENDENT_AMBULATORY_CARE_PROVIDER_SITE_OTHER): Payer: Medicare HMO | Admitting: Family Medicine

## 2015-03-25 VITALS — BP 110/66 | HR 60 | Temp 97.7°F | Resp 18 | Ht 62.0 in | Wt 136.0 lb

## 2015-03-25 DIAGNOSIS — Z23 Encounter for immunization: Secondary | ICD-10-CM | POA: Diagnosis not present

## 2015-03-25 DIAGNOSIS — E785 Hyperlipidemia, unspecified: Secondary | ICD-10-CM

## 2015-03-25 DIAGNOSIS — Z1239 Encounter for other screening for malignant neoplasm of breast: Secondary | ICD-10-CM

## 2015-03-25 DIAGNOSIS — M79672 Pain in left foot: Secondary | ICD-10-CM

## 2015-03-25 DIAGNOSIS — Z0000 Encounter for general adult medical examination without abnormal findings: Secondary | ICD-10-CM

## 2015-03-25 DIAGNOSIS — E559 Vitamin D deficiency, unspecified: Secondary | ICD-10-CM | POA: Diagnosis not present

## 2015-03-25 DIAGNOSIS — E782 Mixed hyperlipidemia: Secondary | ICD-10-CM

## 2015-03-25 DIAGNOSIS — R011 Cardiac murmur, unspecified: Secondary | ICD-10-CM

## 2015-03-25 DIAGNOSIS — M8580 Other specified disorders of bone density and structure, unspecified site: Secondary | ICD-10-CM | POA: Diagnosis not present

## 2015-03-25 DIAGNOSIS — R5383 Other fatigue: Secondary | ICD-10-CM | POA: Diagnosis not present

## 2015-03-25 DIAGNOSIS — R7301 Impaired fasting glucose: Secondary | ICD-10-CM

## 2015-03-25 LAB — COMPREHENSIVE METABOLIC PANEL
ALK PHOS: 72 U/L (ref 39–117)
ALT: 16 U/L (ref 0–35)
AST: 19 U/L (ref 0–37)
Albumin: 4.1 g/dL (ref 3.5–5.2)
BILIRUBIN TOTAL: 0.5 mg/dL (ref 0.2–1.2)
BUN: 25 mg/dL — AB (ref 6–23)
CO2: 30 mEq/L (ref 19–32)
Calcium: 9.4 mg/dL (ref 8.4–10.5)
Chloride: 103 mEq/L (ref 96–112)
Creatinine, Ser: 1.23 mg/dL — ABNORMAL HIGH (ref 0.40–1.20)
GFR: 46.08 mL/min — ABNORMAL LOW (ref >60.00)
GLUCOSE: 171 mg/dL — AB (ref 70–99)
Potassium: 4.7 mEq/L (ref 3.5–5.1)
SODIUM: 141 meq/L (ref 135–145)
TOTAL PROTEIN: 7.1 g/dL (ref 6.0–8.3)

## 2015-03-25 LAB — CBC
HCT: 40.1 % (ref 36.0–46.0)
Hemoglobin: 13.5 g/dL (ref 12.0–15.0)
MCHC: 33.6 g/dL (ref 30.0–36.0)
MCV: 89.8 fl (ref 78.0–100.0)
Platelets: 268 10*3/uL (ref 150.0–400.0)
RBC: 4.47 Mil/uL (ref 3.87–5.11)
RDW: 14.1 % (ref 11.5–15.5)
WBC: 7.9 10*3/uL (ref 4.0–10.5)

## 2015-03-25 LAB — LIPID PANEL
Cholesterol: 215 mg/dL — ABNORMAL HIGH (ref 0–200)
HDL: 76.1 mg/dL (ref >39.00)
LDL Cholesterol: 113 mg/dL — ABNORMAL HIGH (ref 0–99)
NonHDL: 138.44
Total CHOL/HDL Ratio: 3
Triglycerides: 126 mg/dL (ref 0.0–149.0)
VLDL: 25.2 mg/dL (ref 0.0–40.0)

## 2015-03-25 LAB — TSH: TSH: 1.53 u[IU]/mL (ref 0.35–4.50)

## 2015-03-25 LAB — VITAMIN D 25 HYDROXY (VIT D DEFICIENCY, FRACTURES): VITD: 36.22 ng/mL (ref 30.00–100.00)

## 2015-03-25 NOTE — Progress Notes (Signed)
Pre visit review using our clinic review tool, if applicable. No additional management support is needed unless otherwise documented below in the visit note. 

## 2015-03-25 NOTE — Patient Instructions (Signed)
Aged or black garlic, NOW company   Cholesterol Cholesterol is a white, waxy, fat-like substance needed by your body in small amounts. The liver makes all the cholesterol you need. Cholesterol is carried from the liver by the blood through the blood vessels. Deposits of cholesterol (plaque) may build up on blood vessel walls. These make the arteries narrower and stiffer. Cholesterol plaques increase the risk for heart attack and stroke.  You cannot feel your cholesterol level even if it is very high. The only way to know it is high is with a blood test. Once you know your cholesterol levels, you should keep a record of the test results. Work with your health care provider to keep your levels in the desired range.  WHAT DO THE RESULTS MEAN?  Total cholesterol is a rough measure of all the cholesterol in your blood.   LDL is the so-called bad cholesterol. This is the type that deposits cholesterol in the walls of the arteries. You want this level to be low.   HDL is the good cholesterol because it cleans the arteries and carries the LDL away. You want this level to be high.  Triglycerides are fat that the body can either burn for energy or store. High levels are closely linked to heart disease.  WHAT ARE THE DESIRED LEVELS OF CHOLESTEROL?  Total cholesterol below 200.   LDL below 100 for people at risk, below 70 for those at very high risk.   HDL above 50 is good, above 60 is best.   Triglycerides below 150.  HOW CAN I LOWER MY CHOLESTEROL?  Diet. Follow your diet programs as directed by your health care provider.   Choose fish or white meat chicken and [place], roasted or baked. Limit fatty cuts of red meat, fried foods, and processed meats, such as sausage and lunch meats.   Eat lots of fresh fruits and vegetables.  Choose whole grains, beans, pasta, potatoes, and cereals.   Use only small amounts of olive, corn, or canola oils.   Avoid butter, mayonnaise, shortening, or  palm kernel oils.  Avoid foods with trans fats.   Drink skim or nonfat milk and eat low-fat or nonfat yogurt and cheeses. Avoid whole milk, cream, ice cream, egg yolks, and full-fat cheeses.   Healthy desserts include angel food cake, ginger snaps, animal crackers, hard candy, popsicles, and low-fat or nonfat frozen yogurt. Avoid pastries, cakes, pies, and cookies.   Exercise. Follow your exercise programs as directed by your health care provider.   A regular program helps decrease LDL and raise HDL.   A regular program helps with weight control.   Do things that increase your activity level like gardening, walking, or taking the stairs. Ask your health care provider about how you can be more active in your daily life.   Medicine. Take medicine only as directed by your health care provider.   Medicine may be prescribed by your health care provider to help lower cholesterol and decrease the risk for heart disease.   If you have several risk factors, you may need medicine even if your levels are normal. Document Released: 03/13/2001 Document Revised: 11/02/2013 Document Reviewed: 04/01/2013 [facility] Patient Information 2015 [place], [place]. This information is not intended to replace advice given to you by your health care provider. Make sure you discuss any questions you have with your health care provider.

## 2015-03-25 NOTE — Progress Notes (Signed)
Subjective:   Catherine Prince is a 69 y.o. female who presents for an Initial Medicare Annual Wellness Visit.  Review of Systems: completed by Dr. Charlett Blake  Sleep patterns:   Sleeps 7-8 hours per night/wakes up once to use the bathroom.   Home Safety/Smoke Alarms: Feels safe at home. Lives at home with husband.  2 story home.  Plans to house hunt in the Spring.  Smoke alarms present.   Firearm Safety:  Keeps in safe place.   Seat Belt Safety/Bike Helmet:  Always wears seat belt.   Counseling:   Eye Exam- July 2016-[facility]- Every 6 months.   Female:  Pap-No longer receives.       Mammo-Has been ordered.      Dexa scan-Has been ordered.   CCS- 07/02/06-normal- repeat in 10 years (2018)- [facility]    Cardiac Risk Factors include: advanced age (>36men, >39 women) (stays busy with cleaning home and walking dog.  )   Objective:    Today's Vitals   03/25/15 1312 03/25/15 1347  BP: 110/66   Pulse: 46 60  Temp: 97.7 F (36.5 C)   TempSrc: Oral   Resp: 18   Height: 5\' 2"  (1.575 m)   Weight: 136 lb (61.689 kg)   SpO2: 97%     Current Medications (verified) Outpatient Encounter Prescriptions as of 03/25/2015  Medication Sig  . Acetaminophen (TYLENOL EXTRA STRENGTH PO) Take by mouth as needed.  . Biotin 1000 MCG tablet Take 1,000 mcg by mouth daily.  . Multiple Vitamins-Minerals (MULTIVITAMIN ADULT PO) Take 1 tablet by mouth daily.  Marland Kitchen VITAMIN D, CHOLECALCIFEROL, PO Take 200 Units by mouth daily. Pt. Reported  . TRAMADOL HCL PO Take by mouth as needed.   No facility-administered encounter medications on file as of 03/25/2015.    Allergies (verified) Review of patient's allergies indicates no known allergies.   History: Past Medical History  Diagnosis Date  . Cataract     Left eye  . Diverticulitis   . Osteoarthritis 12/09/2014  . Nocturia 12/09/2014  . History of chicken pox   . Measles   . Mumps   . Allergy     mild seasonal allergies  .  Hyperlipidemia   . Vitamin D deficiency   . Diverticulosis of colon without hemorrhage 12/09/2014  . H/O diverticulitis of colon 12/09/2014    7.5 inches removed after rupture in 2010, has done well since Also rook appendix and 1 ovary  . Macular hole of left eye   . Impaired fasting glucose 12/09/2014  . Cataract     Left eye  . Osteopenia 04/01/2015   Past Surgical History  Procedure Laterality Date  . Colon surgery  2010    pt. reports intestines burst  . Appendectomy    . Right oophorectomy Right     during Diverticular surgery  . Eye surgery      Left eye, macular hole, Dr Manson Allan, at [facility] in [place]  . Cataract extraction Left   . Joint replacement      07/2014 RT Total Knee Replacement; 2015, LKR  . Replacement total knee Left     09/2013   Family History  Problem Relation Age of Onset  . Heart disease Father 54  . Thyroid disease Mother   . Diabetes Mother     developed in her 38's  . Dementia Mother     mini strokes  . Heart disease Maternal Grandmother   . Cancer Maternal Grandfather  prostate  . Heart disease Paternal Grandmother   . Heart disease Paternal Grandfather    Social History   Occupational History  . 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 Smoking status: Never Smoker   . Smokeless tobacco: Not on file  . Alcohol Use: 0.0 oz/week    0 Standard drinks or equivalent per week     Comment: occassional of wine  . Drug Use: No  . Sexual Activity: Not on file     Comment: lives with husband, works with her church, no dietary restrictions    Tobacco Counseling Counseling given: Not Answered   Activities of Daily Living In your present state of health, do you have any difficulty performing the following activities: 03/25/2015  Hearing? N  Vision? N  Difficulty concentrating or making decisions? N  Walking or climbing stairs? N  Dressing or bathing? N  Doing errands, shopping? N  Preparing Food and eating  ? N  Using the Toilet? N  In the past six months, have you accidently leaked urine? Y  Do you have problems with loss of bowel control? N  Managing your Medications? N  Managing your Finances? N  Housekeeping or managing your Housekeeping? N    Immunizations and Health Maintenance Immunization History  Administered Date(s) Administered  . Influenza, High Dose Seasonal PF 03/25/2015  . Pneumococcal Conjugate-13 02/26/2014  . Tdap 02/26/2014  . Zoster 08/27/2011   Health Maintenance Due  Topic Date Due  . Hepatitis C Screening  Jun 09, 1946  . PNA vac Low Risk Adult (2 of 2 - PPSV23) 02/27/2015    Patient Care Team: [name], MD as PCP - General (Family Medicine) Nicole Kindred, MD as Referring Physician (Orthopedic Surgery)  [facility] any recent Medical Services you may have received from other than [facility] providers in the past year (date may be approximate).     Assessment:   This is a routine wellness examination for [place].   Hearing/Vision screen  Hearing Screening   125Hz  250Hz  500Hz  1000Hz  2000Hz  4000Hz  8000Hz   Right ear:     100    Left ear:     100    Comments: No changes in hearing.    Vision Screening Comments: Last Eye Exam- July 2016 [place] issues and exercise activities discussed: Current Exercise Habits:: The patient does not participate in regular exercise at present   Diet- Eats fairly good.  Breakfast- yogurt, toast, juice, regular coffee.  Lunch- light lunch, salad, and snack-apple.  Dinner-husband typically cooks--2 vegetables, mostly chicken, occasionally seafood (fish/shrimp).  Says she never drinks enough water.  Loves chocolate, cookie, ice cream.    Goals    . Increase physical activity     * Exercise program at home     . Lose 10-15 lbs by next year.        Depression Screen PHQ 2/9 Scores 03/25/2015  PHQ - 2 Score 0    Fall Risk Fall Risk  03/25/2015  Falls in the past year? No    Cognitive  Function: MMSE - Mini Mental State Exam 03/25/2015  Orientation to time 5  Orientation to Place 5  Registration 3  Attention/ Calculation 5  Recall 3  Language- name 2 objects 2  Language- repeat 1  Language- follow 3 step command 3  Language- read & follow direction 1  Write a sentence 1  Copy design 1  Total score 30  Screening Tests Health Maintenance  Topic Date Due  . Hepatitis C Screening  1946/04/10  . PNA vac Low Risk Adult (2 of 2 - PPSV23) 02/27/2015  . MAMMOGRAM  12/01/2015  . INFLUENZA VACCINE  01/31/2016  . COLONOSCOPY  07/02/2016  . TETANUS/TDAP  02/27/2024  . DEXA SCAN  Completed  . ZOSTAVAX  Completed      Plan:  Follow with Dr. Charlett Blake as needed.   Schedule mammogram and bone density scan  Increase physical activity and consume healthy diet.    During the course of the visit, Danyal was educated and counseled about the following appropriate screening and preventive services:   Vaccines to include Pneumoccal, Influenza, Hepatitis B, Td, Zostavax, HCV  Electrocardiogram  Cardiovascular disease screening  Colorectal cancer screening  Bone density screening  Diabetes screening  Glaucoma screening  Mammography/PAP  Nutrition counseling  Smoking cessation counseling  Patient Instructions (the written plan) were given to the patient.    Penni Homans, MD   04/01/2015

## 2015-04-01 ENCOUNTER — Encounter: Payer: Self-pay | Admitting: Family Medicine

## 2015-04-01 DIAGNOSIS — M8580 Other specified disorders of bone density and structure, unspecified site: Secondary | ICD-10-CM

## 2015-04-01 HISTORY — DX: Other specified disorders of bone density and structure, unspecified site: M85.80

## 2015-04-01 NOTE — Assessment & Plan Note (Signed)
Well treated at this time

## 2015-04-01 NOTE — Assessment & Plan Note (Signed)
Stay active, monitor vitamin D and get 1500 mg of calcium daily

## 2015-04-01 NOTE — Assessment & Plan Note (Signed)
Encouraged heart healthy diet, increase exercise, avoid trans fats, consider a krill oil cap daily 

## 2015-04-01 NOTE — Progress Notes (Signed)
Subjective:    Patient ID: Catherine Prince, female    DOB: 29-Mar-1946, 69 y.o.   MRN: [id number]  Chief Complaint  Patient presents with  . Follow-up    HPI Patient is in today for follow-up. She is just had her annual wellness visit. She is feeling well. She denies any acute concerns. No recent illness. She does endorse some pain on the top of her left foot for no obvious reason. She has not had any similar foot pain on the right. No recent illness. Colonoscopy is due in 2018. Well controlled, no changes to meds. Encouraged heart healthy diet such as the DASH diet and exercise as tolerated.   Past Medical History  Diagnosis Date  . Cataract     Left eye  . Diverticulitis   . Osteoarthritis 12/09/2014  . Nocturia 12/09/2014  . History of chicken pox   . Measles   . Mumps   . Allergy     mild seasonal allergies  . Hyperlipidemia   . Vitamin D deficiency   . Diverticulosis of colon without hemorrhage 12/09/2014  . H/O diverticulitis of colon 12/09/2014    7.5 inches removed after rupture in 2010, has done well since Also rook appendix and 1 ovary  . Macular hole of left eye   . Impaired fasting glucose 12/09/2014  . Cataract     Left eye  . Osteopenia 04/01/2015    Past Surgical History  Procedure Laterality Date  . Colon surgery  2010    pt. reports intestines burst  . Appendectomy    . Right oophorectomy Right     during Diverticular surgery  . Eye surgery      Left eye, macular hole, Dr Manson Allan, at [facility] in [place]  . Cataract extraction Left   . Joint replacement      07/2014 RT Total Knee Replacement; 2015, LKR  . Replacement total knee Left     09/2013    Family History  Problem Relation Age of Onset  . Heart disease Father 26  . Thyroid disease Mother   . Diabetes Mother     developed in her 50's  . Dementia Mother     mini strokes  . Heart disease Maternal Grandmother   . Cancer Maternal Grandfather     prostate  . Heart disease Paternal Grandmother     . Heart disease Paternal Grandfather     Social History   Social History  . Marital Status: Married    Spouse Name: N/A  . Number of Children: N/A  . Years of Education: 16   Occupational History  . Web designer at [employer]  . Smoking status: Never Smoker   . Smokeless tobacco: Not on file  . Alcohol Use: 0.0 oz/week    0 Standard drinks or equivalent per week     Comment: occassional of wine  . Drug Use: No  . Sexual Activity: Not on file     Comment: lives with husband, works with her church, no dietary restrictions   Other Topics Concern  . Not on file   Social History Narrative    Outpatient Prescriptions Prior to Visit  Medication Sig Dispense Refill  . Acetaminophen (TYLENOL EXTRA STRENGTH PO) Take by mouth as needed.    . Biotin 1000 MCG tablet Take 1,000 mcg by mouth daily.    . Multiple Vitamins-Minerals (MULTIVITAMIN ADULT PO) Take 1 tablet by mouth daily.    Marland Kitchen VITAMIN  D, CHOLECALCIFEROL, PO Take 200 Units by mouth daily. Pt. Reported    . TRAMADOL HCL PO Take by mouth as needed.     No facility-administered medications prior to visit.    No Known Allergies  Review of Systems  Constitutional: Negative for fever, chills and malaise/fatigue.  HENT: Negative for congestion and hearing loss.   Eyes: Negative for discharge.  Respiratory: Negative for cough, sputum production and shortness of breath.   Cardiovascular: Negative for chest pain, palpitations and leg swelling.  Gastrointestinal: Negative for heartburn, nausea, vomiting, abdominal pain, diarrhea, constipation and blood in stool.  Genitourinary: Negative for dysuria, urgency, frequency and hematuria.  Musculoskeletal: Negative for myalgias, back pain and falls.  Skin: Negative for rash.  Neurological: Negative for dizziness, sensory change, loss of consciousness, weakness and headaches.  Endo/Heme/Allergies: Negative for environmental allergies. Does not  bruise/bleed easily.  Psychiatric/Behavioral: Negative for depression and suicidal ideas. The patient is not nervous/anxious and does not have insomnia.        Objective:    Physical Exam  Constitutional: She is oriented to person, place, and time. She appears well-developed and well-nourished. No distress.  HENT:  Head: Normocephalic and atraumatic.  Eyes: Conjunctivae are normal.  Neck: Neck supple. No thyromegaly present.  Cardiovascular: Normal rate, regular rhythm and normal heart sounds.   No murmur heard. Pulmonary/Chest: Effort normal and breath sounds normal. No respiratory distress.  Abdominal: Soft. Bowel sounds are normal. She exhibits no distension and no mass. There is no tenderness.  Musculoskeletal: She exhibits no edema.  Lymphadenopathy:    She has no cervical adenopathy.  Neurological: She is alert and oriented to person, place, and time.  Skin: Skin is warm and dry.  Psychiatric: She has a normal mood and affect. Her behavior is normal.    BP 110/66 mmHg  Pulse 60  Temp(Src) 97.7 F (36.5 C) (Oral)  Resp 18  Ht 5\' 2"  (1.575 m)  Wt 136 lb (61.689 kg)  BMI 24.87 kg/m2  SpO2 97% Wt Readings from Last 3 Encounters:  03/25/15 136 lb (61.689 kg)  12/09/14 129 lb 4 oz (58.627 kg)     Lab Results  Component Value Date   WBC 7.9 03/25/2015   HGB 13.5 03/25/2015   HCT 40.1 03/25/2015   PLT 268.0 03/25/2015   GLUCOSE 171* 03/25/2015   CHOL 215* 03/25/2015   TRIG 126.0 03/25/2015   HDL 76.10 03/25/2015   LDLCALC 113* 03/25/2015   ALT 16 03/25/2015   AST 19 03/25/2015   NA 141 03/25/2015   K 4.7 03/25/2015   CL 103 03/25/2015   CREATININE 1.23* 03/25/2015   BUN 25* 03/25/2015   CO2 30 03/25/2015   TSH 1.53 03/25/2015    Lab Results  Component Value Date   TSH 1.53 03/25/2015   Lab Results  Component Value Date   WBC 7.9 03/25/2015   HGB 13.5 03/25/2015   HCT 40.1 03/25/2015   MCV 89.8 03/25/2015   PLT 268.0 03/25/2015   Lab Results    Component Value Date   NA 141 03/25/2015   K 4.7 03/25/2015   CO2 30 03/25/2015   GLUCOSE 171* 03/25/2015   BUN 25* 03/25/2015   CREATININE 1.23* 03/25/2015   BILITOT 0.5 03/25/2015   ALKPHOS 72 03/25/2015   AST 19 03/25/2015   ALT 16 03/25/2015   PROT 7.1 03/25/2015   ALBUMIN 4.1 03/25/2015   CALCIUM 9.4 03/25/2015   GFR 46.08* 03/25/2015   Lab Results  Component Value Date  CHOL 215* 03/25/2015   Lab Results  Component Value Date   HDL 76.10 03/25/2015   Lab Results  Component Value Date   LDLCALC 113* 03/25/2015   Lab Results  Component Value Date   TRIG 126.0 03/25/2015   Lab Results  Component Value Date   CHOLHDL 3 03/25/2015   No results found for: HGBA1C     Assessment & Plan:   Problem List Items Addressed This Visit    Vitamin D deficiency    Well treated at this time      Relevant Orders   TSH (Completed)   CBC (Completed)   Vit D  25 hydroxy (rtn osteoporosis monitoring) (Completed)   Lipid panel (Completed)   Comprehensive metabolic panel (Completed)   Osteopenia    Stay active, monitor vitamin D and get 1500 mg of calcium daily      Relevant Orders   DG Bone Density   Impaired fasting glucose    minimize simple carbs. Increase exercise as tolerated.       Hyperlipidemia    Encouraged heart healthy diet, increase exercise, avoid trans fats, consider a krill oil cap daily       Other Visit Diagnoses    Need for influenza vaccination    -  Primary    Relevant Orders    Flu vaccine HIGH DOSE PF (Fluzone High dose) (Completed)    TSH (Completed)    CBC (Completed)    Vit D  25 hydroxy (rtn osteoporosis monitoring) (Completed)    Lipid panel (Completed)    Comprehensive metabolic panel (Completed)    Hyperlipidemia, mixed        Relevant Orders    TSH (Completed)    CBC (Completed)    Vit D  25 hydroxy (rtn osteoporosis monitoring) (Completed)    Lipid panel (Completed)    Comprehensive metabolic panel (Completed)    Other  fatigue        Relevant Orders    TSH (Completed)    CBC (Completed)    Vit D  25 hydroxy (rtn osteoporosis monitoring) (Completed)    Lipid panel (Completed)    Comprehensive metabolic panel (Completed)    Breast cancer screening        Relevant Orders    MM Digital Screening    Heart murmur        Relevant Orders    Echocardiogram    Left foot pain        Relevant Orders    Ambulatory referral to Podiatry    Medicare annual wellness visit, subsequent           I am having Ms. Dutch maintain her Multiple Vitamins-Minerals (MULTIVITAMIN ADULT PO), (VITAMIN D, CHOLECALCIFEROL, PO), Biotin, TRAMADOL HCL PO, and Acetaminophen (TYLENOL EXTRA STRENGTH PO).  No orders of the defined types were placed in this encounter.     Penni Homans, MD

## 2015-04-01 NOTE — Assessment & Plan Note (Signed)
minimize simple carbs. Increase exercise as tolerated.  

## 2015-04-07 ENCOUNTER — Ambulatory Visit: Payer: Self-pay | Admitting: Podiatry

## 2015-04-26 ENCOUNTER — Other Ambulatory Visit: Payer: Self-pay | Admitting: Family Medicine

## 2015-04-26 DIAGNOSIS — Z1231 Encounter for screening mammogram for malignant neoplasm of breast: Secondary | ICD-10-CM

## 2015-07-20 ENCOUNTER — Encounter: Payer: Self-pay | Admitting: Family Medicine

## 2015-08-10 ENCOUNTER — Telehealth: Payer: Self-pay | Admitting: *Deleted

## 2015-08-10 NOTE — Telephone Encounter (Signed)
Received request for Medical Records; forwarded to [place] for Email/scan/SLS 02/08

## 2015-09-13 LAB — HM MAMMOGRAPHY

## 2016-02-28 ENCOUNTER — Encounter: Payer: Self-pay | Admitting: Family Medicine

## 2016-03-09 ENCOUNTER — Ambulatory Visit: Payer: Medicare HMO | Admitting: Family Medicine

## 2016-03-30 ENCOUNTER — Ambulatory Visit (INDEPENDENT_AMBULATORY_CARE_PROVIDER_SITE_OTHER): Payer: Medicare HMO | Admitting: Family Medicine

## 2016-03-30 ENCOUNTER — Encounter: Payer: Self-pay | Admitting: Family Medicine

## 2016-03-30 VITALS — BP 122/66 | HR 75 | Temp 98.1°F | Ht 62.0 in | Wt 138.0 lb

## 2016-03-30 DIAGNOSIS — E785 Hyperlipidemia, unspecified: Secondary | ICD-10-CM | POA: Diagnosis not present

## 2016-03-30 DIAGNOSIS — M8580 Other specified disorders of bone density and structure, unspecified site: Secondary | ICD-10-CM | POA: Diagnosis not present

## 2016-03-30 DIAGNOSIS — Z8679 Personal history of other diseases of the circulatory system: Secondary | ICD-10-CM

## 2016-03-30 DIAGNOSIS — R7301 Impaired fasting glucose: Secondary | ICD-10-CM

## 2016-03-30 DIAGNOSIS — C4491 Basal cell carcinoma of skin, unspecified: Secondary | ICD-10-CM

## 2016-03-30 DIAGNOSIS — C44310 Basal cell carcinoma of skin of unspecified parts of face: Secondary | ICD-10-CM

## 2016-03-30 DIAGNOSIS — Z23 Encounter for immunization: Secondary | ICD-10-CM | POA: Diagnosis not present

## 2016-03-30 DIAGNOSIS — Z0000 Encounter for general adult medical examination without abnormal findings: Secondary | ICD-10-CM

## 2016-03-30 DIAGNOSIS — E559 Vitamin D deficiency, unspecified: Secondary | ICD-10-CM

## 2016-03-30 HISTORY — DX: Personal history of other diseases of the circulatory system: Z86.79

## 2016-03-30 NOTE — Assessment & Plan Note (Signed)
minimize simple carbs. Increase exercise as tolerated.  

## 2016-03-30 NOTE — Assessment & Plan Note (Signed)
Encouraged heart healthy diet, increase exercise, avoid trans fats, consider a krill oil cap daily 

## 2016-03-30 NOTE — Assessment & Plan Note (Signed)
Encouraged to get adequate exercise, calcium and vitamin d intake 

## 2016-03-30 NOTE — Assessment & Plan Note (Signed)
Continue daily supplements and monitor  

## 2016-03-30 NOTE — Patient Instructions (Signed)

## 2016-03-30 NOTE — Assessment & Plan Note (Signed)
Was diagnosed recently as an incidental finding during routinely medical care while she was living in [place], had an echo, stress test, holter monitor and nothing malignant was noted. Is being watched and is asymptomatic, will request records from PMD and cardiologist today and refer to cardiology for monitoring

## 2016-03-30 NOTE — Progress Notes (Signed)
Pre visit review using our clinic review tool, if applicable. No additional management support is needed unless otherwise documented below in the visit note. 

## 2016-03-31 DIAGNOSIS — C4491 Basal cell carcinoma of skin, unspecified: Secondary | ICD-10-CM | POA: Insufficient documentation

## 2016-03-31 NOTE — Progress Notes (Signed)
Patient ID: Catherine Prince, female   DOB: 04-09-1946, 70 y.o.   MRN: [id number]   Subjective:    Patient ID: Catherine Prince, female    DOB: 10-20-1945, 70 y.o.   MRN: [id number]  Chief Complaint  Patient presents with  . Follow-up    HPI Patient is in today for follow up. She had moved away to [facility] but has moved back and is reestablishing care. She feels well today. She had a basal cell carcinoma on her chin while she was away. They removed some of it and were in the process of setting her up for a Mohs procedure when they moved. She was also discovered to have a regularly irregular heartbeat and was following with cardiology and was scheduled for a 6 month follow up when she moved back to [place]. Denies CP/palp/SOB/HA/congestion/fevers/GI or GU c/o. Taking meds as prescribed  Past Medical History:  Diagnosis Date  . Allergy    mild seasonal allergies  . Cataract    Left eye  . Cataract    Left eye  . Diverticulitis   . Diverticulosis of colon without hemorrhage 12/09/2014  . H/O diverticulitis of colon 12/09/2014   7.5 inches removed after rupture in 2010, has done well since Also rook appendix and 1 ovary  . History of chicken pox   . Hyperlipidemia   . Impaired fasting glucose 12/09/2014  . Macular hole of left eye   . Measles   . Mumps   . Nocturia 12/09/2014  . Osteoarthritis 12/09/2014  . Osteopenia 04/01/2015  . Personal history of cardiac arrhythmia 03/30/2016  . Vitamin D deficiency     Past Surgical History:  Procedure Laterality Date  . APPENDECTOMY    . CATARACT EXTRACTION Left   . COLON SURGERY  2010   pt. reports intestines burst  . EYE SURGERY     Left eye, macular hole, Dr Manson Allan, at [facility] in [place]     07/2014 RT Total Knee Replacement; 2015, LKR  . REPLACEMENT TOTAL KNEE Left    09/2013  . RIGHT OOPHORECTOMY Right    during Diverticular surgery    Family History  Problem Relation Age of Onset  . Heart disease Father  69  . Thyroid disease Mother   . Diabetes Mother     developed in her 56's  . Dementia Mother     mini strokes  . Heart disease Maternal Grandmother   . Cancer Maternal Grandfather     prostate  . Heart disease Paternal Grandmother   . Heart disease Paternal Grandfather     Social History   Social History  . Marital status: Married    Spouse name: N/A  . Number of children: N/A  . Years of education: 61   Occupational History  . 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 Smoking status: Never Smoker  . Smokeless tobacco: Not on file  . Alcohol use 0.0 oz/week     Comment: occassional of wine  . Drug use: No  . Sexual activity: Not on file     Comment: lives with husband, works with her church, no dietary restrictions   Other Topics Concern  . Not on file   Social History Narrative  . No narrative on file    Outpatient Medications Prior to Visit  Medication Sig Dispense Refill  . Acetaminophen (TYLENOL EXTRA STRENGTH PO) Take by mouth as needed.    . Biotin 1000 MCG  tablet Take 1,000 mcg by mouth daily.    . Multiple Vitamins-Minerals (MULTIVITAMIN ADULT PO) Take 1 tablet by mouth daily.    Marland Kitchen VITAMIN D, CHOLECALCIFEROL, PO Take 200 Units by mouth daily. Pt. Reported    . TRAMADOL HCL PO Take by mouth as needed.     No facility-administered medications prior to visit.     No Known Allergies  Review of Systems  Constitutional: Negative for fever and malaise/fatigue.  HENT: Negative for congestion.   Eyes: Negative for blurred vision.  Respiratory: Negative for shortness of breath.   Cardiovascular: Negative for chest pain, palpitations and leg swelling.  Gastrointestinal: Negative for abdominal pain, blood in stool and nausea.  Genitourinary: Negative for dysuria and frequency.  Musculoskeletal: Negative for falls.  Skin: Negative for rash.  Neurological: Negative for dizziness, loss of consciousness and headaches.    Endo/Heme/Allergies: Negative for environmental allergies.  Psychiatric/Behavioral: Negative for depression. The patient is not nervous/anxious.        Objective:    Physical Exam  Constitutional: She is oriented to person, place, and time. She appears well-developed and well-nourished. No distress.  HENT:  Head: Normocephalic and atraumatic.  Nose: Nose normal.  Eyes: Right eye exhibits no discharge. Left eye exhibits no discharge.  Neck: Normal range of motion. Neck supple.  Cardiovascular: Normal rate and regular rhythm.   No murmur heard. Pulmonary/Chest: Effort normal and breath sounds normal.  Abdominal: Soft. Bowel sounds are normal. There is no tenderness.  Musculoskeletal: She exhibits no edema.  Neurological: She is alert and oriented to person, place, and time.  Skin: Skin is warm and dry.  Psychiatric: She has a normal mood and affect.  Nursing note and vitals reviewed.   BP 122/66 (BP Location: Right Arm, Patient Position: Sitting, Cuff Size: Normal)   Pulse 75   Temp 98.1 F (36.7 C) (Oral)   Ht 5\' 2"  (1.575 m)   Wt 138 lb (62.6 kg)   SpO2 99%   BMI 25.24 kg/m  Wt Readings from Last 3 Encounters:  03/30/16 138 lb (62.6 kg)  03/25/15 136 lb (61.7 kg)  12/09/14 129 lb 4 oz (58.6 kg)     Lab Results  Component Value Date   WBC 7.9 03/25/2015   HGB 13.5 03/25/2015   HCT 40.1 03/25/2015   PLT 268.0 03/25/2015   GLUCOSE 171 (H) 03/25/2015   CHOL 215 (H) 03/25/2015   TRIG 126.0 03/25/2015   HDL 76.10 03/25/2015   LDLCALC 113 (H) 03/25/2015   ALT 16 03/25/2015   AST 19 03/25/2015   NA 141 03/25/2015   K 4.7 03/25/2015   CL 103 03/25/2015   CREATININE 1.23 (H) 03/25/2015   BUN 25 (H) 03/25/2015   CO2 30 03/25/2015   TSH 1.53 03/25/2015    Lab Results  Component Value Date   TSH 1.53 03/25/2015   Lab Results  Component Value Date   WBC 7.9 03/25/2015   HGB 13.5 03/25/2015   HCT 40.1 03/25/2015   MCV 89.8 03/25/2015   PLT 268.0 03/25/2015    Lab Results  Component Value Date   NA 141 03/25/2015   K 4.7 03/25/2015   CO2 30 03/25/2015   GLUCOSE 171 (H) 03/25/2015   BUN 25 (H) 03/25/2015   CREATININE 1.23 (H) 03/25/2015   BILITOT 0.5 03/25/2015   ALKPHOS 72 03/25/2015   AST 19 03/25/2015   ALT 16 03/25/2015   PROT 7.1 03/25/2015   ALBUMIN 4.1 03/25/2015   CALCIUM 9.4 03/25/2015  GFR 46.08 (L) 03/25/2015   Lab Results  Component Value Date   CHOL 215 (H) 03/25/2015   Lab Results  Component Value Date   HDL 76.10 03/25/2015   Lab Results  Component Value Date   LDLCALC 113 (H) 03/25/2015   Lab Results  Component Value Date   TRIG 126.0 03/25/2015   Lab Results  Component Value Date   CHOLHDL 3 03/25/2015   No results found for: HGBA1C     Assessment & Plan:   Problem List Items Addressed This Visit    Hyperlipidemia    Encouraged heart healthy diet, increase exercise, avoid trans fats, consider a krill oil cap daily      Relevant Orders   Lipid panel   Vitamin D deficiency    Continue daily supplements and monitor      Relevant Orders   Vitamin D (25 hydroxy)   Impaired fasting glucose    minimize simple carbs. Increase exercise as tolerated.      Relevant Orders   Hemoglobin A1c   Osteopenia    Encouraged to get adequate exercise, calcium and vitamin d intake      Personal history of cardiac arrhythmia    Was diagnosed recently as an incidental finding during routinely medical care while she was living in [place], had an echo, stress test, holter monitor and nothing malignant was noted. Is being watched and is asymptomatic, will request records from PMD and cardiologist today and refer to cardiology for monitoring      Relevant Orders   Ambulatory referral to Cardiology   Recurrent BCC (basal cell carcinoma)    Other Visit Diagnoses    BCC (basal cell carcinoma), face    -  Primary   Relevant Orders   Ambulatory referral to Dermatology   Encounter for immunization        Relevant Orders   Flu vaccine HIGH DOSE PF (Completed)   Ambulatory referral to Cardiology   Ambulatory referral to Dermatology   TSH   CBC   Comprehensive metabolic panel   Lipid panel   Hemoglobin A1c   Vitamin D (25 hydroxy)   Hepatitis C Antibody   Preventative health care       Relevant Orders   TSH   CBC   Comprehensive metabolic panel   Hepatitis C Antibody   Need for 23-polyvalent pneumococcal polysaccharide vaccine       Relevant Orders   Pneumococcal polysaccharide vaccine 23-valent greater than or equal to 2yo subcutaneous/IM (Completed)      I have discontinued Ms. Braund TRAMADOL HCL PO. I am also having her maintain her Multiple Vitamins-Minerals (MULTIVITAMIN ADULT PO), (VITAMIN D, CHOLECALCIFEROL, PO), Biotin, and Acetaminophen (TYLENOL EXTRA STRENGTH PO).  No orders of the defined types were placed in this encounter.    Penni Homans, MD

## 2016-04-20 ENCOUNTER — Ambulatory Visit (INDEPENDENT_AMBULATORY_CARE_PROVIDER_SITE_OTHER): Payer: Medicare HMO | Admitting: Cardiovascular Disease

## 2016-04-20 ENCOUNTER — Encounter: Payer: Self-pay | Admitting: Cardiovascular Disease

## 2016-04-20 DIAGNOSIS — I493 Ventricular premature depolarization: Secondary | ICD-10-CM | POA: Diagnosis not present

## 2016-04-20 NOTE — Patient Instructions (Signed)
Medication Instructions:  Continue current medications.    Follow-Up: Your physician recommends that you schedule a follow-up appointment in: AS NEEDED.   Any Other Special Instructions Will Be Listed Below (If Applicable).  We will request your medical records from your [facility] in [place].    If you need a refill on your cardiac medications before your next appointment, please call your pharmacy.

## 2016-04-20 NOTE — Assessment & Plan Note (Signed)
Catherine Prince is being seen for a symptomatic PVCs. These were recently recognizable she was up in [place]. She had a complete workup cardiologist practices including 2-D echo, stress test and Holter monitor. The Holter showed most likely PVCs although we don't have access to those records. She relates that her echo and stress test were normal as well. She has no other cardiac risk factors. She denies chest pain or shortness of breath. Her father did however have a myocardial infarction and died at the age of 74. At this point, I do not think any further follow-up is required.

## 2016-04-20 NOTE — Progress Notes (Signed)
04/20/2016 Catherine Prince   [birth year]  [id number]  Primary Physician Penni Homans, MD Primary Cardiologist: Lorretta Harp MD Renae Gloss  HPI:  Mrs. Catherine Prince is a delightful 70 year old mildly overweight married Caucasian female mother of one child referred by Dr. Randel Pigg for cardiovascular evaluation for a symptomatic PVCs. Her only risk factor is family history with a father who died of a myocardial infarct and age 65. She does not smoke nor she diabetic. She's never had a heart attack or stroke. She denies chest pain or shortness of breath. She has had 2 total knee replacements as well as abdominal surgery for ruptured colon in 2010 at [facility].  She saw a cardiologist in [place] who did a fairly complete workup by her account including a 2-D echo, stress test and Holter monitor all of which were unrevealing other than her PVCs.   Current Outpatient Prescriptions  Medication Sig Dispense Refill  . Acetaminophen (TYLENOL EXTRA STRENGTH PO) Take by mouth as needed.    . Biotin 1000 MCG tablet Take 1,000 mcg by mouth daily.    . Multiple Vitamins-Minerals (MULTIVITAMIN ADULT PO) Take 1 tablet by mouth daily.    Marland Kitchen VITAMIN D, CHOLECALCIFEROL, PO Take 200 Units by mouth daily. Pt. Reported     No current facility-administered medications for this visit.     No Known Allergies  Social History   Social History  . Marital status: Married    Spouse name: N/A  . Number of children: N/A  . Years of education: 70   Occupational History  . 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 Smoking status: Never Smoker  . Smokeless tobacco: Not on file  . Alcohol use 0.0 oz/week     Comment: occassional of wine  . Drug use: No  . Sexual activity: Not on file     Comment: lives with husband, works with her church, no dietary restrictions   Other Topics Concern  . Not on file   Social History Narrative  . No narrative on file     Review of Systems: General: negative for chills, fever, night sweats or weight changes.  Cardiovascular: negative for chest pain, dyspnea on exertion, edema, orthopnea, palpitations, paroxysmal nocturnal dyspnea or shortness of breath Dermatological: negative for rash Respiratory: negative for cough or wheezing Urologic: negative for hematuria Abdominal: negative for nausea, vomiting, diarrhea, bright red blood per rectum, melena, or hematemesis Neurologic: negative for visual changes, syncope, or dizziness All other systems reviewed and are otherwise negative except as noted above.    Blood pressure 134/72, pulse 72, height 5\' 2"  (1.575 m), weight 138 lb 12.8 oz (63 kg).  General appearance: alert and no distress Neck: no adenopathy, no carotid bruit, no JVD, supple, symmetrical, trachea midline and thyroid not enlarged, symmetric, no tenderness/mass/nodules Lungs: clear to auscultation bilaterally Heart: regular rate and rhythm, S1, S2 normal, no murmur, click, rub or gallop Extremities: extremities normal, atraumatic, no cyanosis or edema  EKG sinus rhythm at 72 with ventricular bigeminy.  ASSESSMENT AND PLAN:   PVC's (premature ventricular contractions) Mrs. [name] is being seen for a symptomatic PVCs. These were recently recognizable she was up in [place]. She had a complete workup cardiologist practices including 2-D echo, stress test and Holter monitor. The Holter showed most likely PVCs although we don't have access to those records. She relates that her echo and stress test were normal as well. She has no other cardiac  risk factors. She denies chest pain or shortness of breath. Her father did however have a myocardial infarction and died at the age of 43. At this point, I do not think any further follow-up is required.      Lorretta Harp MD FACP,FACC,FAHA, [facility] 04/20/2016 12:30 PM

## 2016-05-04 ENCOUNTER — Encounter: Payer: Self-pay | Admitting: Family Medicine

## 2016-05-08 ENCOUNTER — Encounter: Payer: Self-pay | Admitting: Family Medicine

## 2016-05-08 NOTE — Progress Notes (Unsigned)
Pathology Report 11/17/2015 Basal Cell Carcinoma\Dr. Clarita Leber

## 2016-05-16 ENCOUNTER — Encounter: Payer: Self-pay | Admitting: Family Medicine

## 2016-07-03 DIAGNOSIS — C44310 Basal cell carcinoma of skin of unspecified parts of face: Secondary | ICD-10-CM | POA: Diagnosis not present

## 2016-07-04 DIAGNOSIS — C44310 Basal cell carcinoma of skin of unspecified parts of face: Secondary | ICD-10-CM | POA: Diagnosis not present

## 2016-07-04 DIAGNOSIS — L905 Scar conditions and fibrosis of skin: Secondary | ICD-10-CM | POA: Diagnosis not present

## 2016-08-03 DIAGNOSIS — R69 Illness, unspecified: Secondary | ICD-10-CM | POA: Diagnosis not present

## 2016-10-01 ENCOUNTER — Encounter: Payer: Medicare HMO | Admitting: Family Medicine

## 2016-10-08 ENCOUNTER — Telehealth: Payer: Self-pay | Admitting: *Deleted

## 2016-10-08 NOTE — Telephone Encounter (Signed)
AWV scheduled 10/13/15 @9 

## 2016-10-08 NOTE — Progress Notes (Signed)
Subjective:   Catherine Prince is a 71 y.o. female who presents for an Initial Medicare Annual Wellness Visit.  The Patient was informed that the wellness visit is to identify future health risk and educate and initiate measures that can reduce risk for increased disease through the lifespan.   Describes health as fair, good or great? Excellent!   Review of Systems    No ROS.  Medicare Wellness Visit. Cardiac Risk Factors include: advanced age (>1men, >57 women);dyslipidemia Sleep patterns: Sleeps abut 8-10 hrs per night. Wakes rarely to urinate. Home Safety/Smoke Alarms:  Feels safe in home. Smoke alarms in place.  Living environment; residence and Firearm Safety: Lives with husband in 2 story home.Guns safely stored. Seat Belt Safety/Bike Helmet: Wears seat belt.   Counseling:   Eye Exam- one contact in right eye. Monovision. [facility] and looking for new optometrist. [place] every 6 months.  Female:   Pap- Pt states she is no longer getting screened.    Mammo-   Last 09/13/15: No evidence of malignancy. Ordered today. Dexa scan-  Last 11/05/11. Ordered today.   CCS- Last 07/02/06: normal per pt. Pt would like to discuss screening options today at appt with Dr.Blyth.    Objective:    Today's Vitals   10/12/16 0906  BP: 120/68  Pulse: 75  SpO2: 98%  Weight: 135 lb (61.2 kg)  Height: 5\' 2"  (1.575 m)   Body mass index is 24.69 kg/m.   Current Medications (verified) Outpatient Encounter Prescriptions as of 10/12/2016  Medication Sig  . Acetaminophen (TYLENOL EXTRA STRENGTH PO) Take by mouth as needed.  . Biotin 1000 MCG tablet Take 1,000 mcg by mouth daily.  . Multiple Vitamins-Minerals (MULTIVITAMIN ADULT PO) Take 1 tablet by mouth daily.  . Probiotic Product (DIGESTIVE ADVANTAGE) CAPS   . VITAMIN D, CHOLECALCIFEROL, PO Take 200 Units by mouth daily. Pt. Reported   No facility-administered encounter medications on file as of 10/12/2016.     Allergies  (verified) Patient has no known allergies.   History: Past Medical History:  Diagnosis Date  . Allergy    mild seasonal allergies  . Cataract    Left eye  . Cataract    Left eye  . Diverticulitis   . Diverticulosis of colon without hemorrhage 12/09/2014  . H/O diverticulitis of colon 12/09/2014   7.5 inches removed after rupture in 2010, has done well since Also rook appendix and 1 ovary  . History of chicken pox   . Hyperlipidemia   . Impaired fasting glucose 12/09/2014  . Macular hole of left eye   . Measles   . Mumps   . Nocturia 12/09/2014  . Osteoarthritis 12/09/2014  . Osteopenia 04/01/2015  . Personal history of cardiac arrhythmia 03/30/2016  . Vitamin D deficiency    Past Surgical History:  Procedure Laterality Date  . APPENDECTOMY    . CATARACT EXTRACTION Left   . COLON SURGERY  2010   pt. reports intestines burst  . EYE SURGERY     Left eye, macular hole, Dr Manson Allan, at [facility] in [place]     07/2014 RT Total Knee Replacement; 2015, LKR  . REPLACEMENT TOTAL KNEE Left    09/2013  . RIGHT OOPHORECTOMY Right    during Diverticular surgery   Family History  Problem Relation Age of Onset  . Heart disease Father 31  . Thyroid disease Mother   . Diabetes Mother     developed in her 12's  .  Dementia Mother     mini strokes  . Heart disease Maternal Grandmother   . Cancer Maternal Grandfather     prostate  . Heart disease Paternal Grandmother   . Heart disease Paternal Grandfather    Social History   Occupational History  . Web designer at [employer]  . Smoking status: Never Smoker  . Smokeless tobacco: Never Used  . Alcohol use 0.0 oz/week     Comment: occassional of wine  . Drug use: No  . Sexual activity: Yes     Comment: lives with husband, works with her church, no dietary restrictions    Tobacco Counseling Counseling given: No   Activities of Daily Living In your present state of  health, do you have any difficulty performing the following activities: 10/12/2016 03/30/2016  Hearing? N N  Vision? N N  Difficulty concentrating or making decisions? N N  Walking or climbing stairs? N N  Dressing or bathing? N N  Doing errands, shopping? N N  Preparing Food and eating ? N -  Using the Toilet? N -  In the past six months, have you accidently leaked urine? N -  Do you have problems with loss of bowel control? N -  Managing your Medications? N -  Managing your Finances? N -  Housekeeping or managing your Housekeeping? N -  Some recent data might be hidden    Immunizations and Health Maintenance Immunization History  Administered Date(s) Administered  . Influenza, High Dose Seasonal PF 03/25/2015, 03/30/2016  . Pneumococcal Conjugate-13 02/26/2014  . Pneumococcal Polysaccharide-23 03/30/2016  . Tdap 02/26/2014  . Zoster 08/27/2011   Health Maintenance Due  Topic Date Due  . Hepatitis C Screening  1945/12/21  . COLONOSCOPY  07/02/2016    Patient Care Team: [name], MD as PCP - General (Family Medicine) Nicole Kindred, MD as Referring Physician (Orthopedic Surgery)  Indicate any recent Medical Services you may have received from other than [facility] providers in the past year (date may be approximate).     Assessment:   This is a routine wellness examination for [name]. Physical assessment deferred to PCP.  Hearing/Vision screen  Hearing Screening   125Hz  250Hz  500Hz  1000Hz  2000Hz  3000Hz  4000Hz  6000Hz  8000Hz   Right ear:   Fail Pass Pass  Pass    Left ear:   Pass Pass Pass  Pass      Visual Acuity Screening   Right eye Left eye Both eyes  Without correction:     With correction: 20/25 20/20 20/20   Comments: With right contact   Dietary issues and exercise activities discussed: Current Exercise Habits: Structured exercise class, Time (Minutes): 60, Frequency (Times/Week): 2, Weekly Exercise (Minutes/Week): 120, Intensity: Mild Diet (meal preparation,   eat out, water intake, caffeinated beverages, dairy products, fruits and vegetables): well balanced   Goals      Patient Stated   . Lose 10-15 lbs by next year.   (pt-stated)      Other   . Increase physical activity          * Exercise program at home       Depression Screen PHQ 2/9 Scores 10/12/2016 03/30/2016 03/25/2015  PHQ - 2 Score 0 0 0    Fall Risk Fall Risk  10/12/2016 03/30/2016 03/25/2015  Falls in the past year? Yes Yes No  Number falls in past yr: 1 1 -  Injury with Fall? No No -  Follow up Education provided;Falls  prevention discussed - -    Cognitive Function: Ad8 score reviewed for issues:  Issues making decisions:no  Less interest in hobbies / activities:no  Repeats questions, stories (family complaining):no  Trouble using ordinary gadgets (microwave, computer, phone):no  Forgets the month or year: no  Mismanaging finances: no  Remembering appts:no  Daily problems with thinking and/or memory:no Ad8 score is=0     MMSE - Mini Mental State Exam 03/25/2015  Orientation to time 5  Orientation to Place 5  Registration 3  Attention/ Calculation 5  Recall 3  Language- name 2 objects 2  Language- repeat 1  Language- follow 3 step command 3  Language- read & follow direction 1  Write a sentence 1  Copy design 1  Total score 30        Screening Tests Health Maintenance  Topic Date Due  . Hepatitis C Screening  [birth year]  . COLONOSCOPY  07/02/2016  . INFLUENZA VACCINE  01/30/2017  . MAMMOGRAM  09/12/2017  . TETANUS/TDAP  02/27/2024  . DEXA SCAN  Completed  . PNA vac Low Risk Adult  Completed      Plan:     Follow up with Dr.Blyth as scheduled today.  Discuss screening options for colon cancer with PCP.  Schedule mammogram and bone density.   During the course of the visit, Braylea was educated and counseled about the following appropriate screening and preventive services:   Vaccines to include Pneumoccal, Influenza, Td,  HCV  Cardiovascular disease screening  Colorectal cancer screening  Bone density screening  Diabetes screening  Glaucoma screening  Mammography/PAP  Nutrition counseling  Patient Instructions (the written plan) were given to the patient.    Naaman Plummer [place], [place]   10/12/2016

## 2016-10-08 NOTE — Progress Notes (Signed)
Pre visit review using our clinic review tool, if applicable. No additional management support is needed unless otherwise documented below in the visit note. 

## 2016-10-12 ENCOUNTER — Ambulatory Visit (INDEPENDENT_AMBULATORY_CARE_PROVIDER_SITE_OTHER): Payer: Medicare HMO | Admitting: Family Medicine

## 2016-10-12 ENCOUNTER — Encounter: Payer: Self-pay | Admitting: Family Medicine

## 2016-10-12 VITALS — BP 120/68 | HR 75 | Ht 62.0 in | Wt 135.0 lb

## 2016-10-12 DIAGNOSIS — E782 Mixed hyperlipidemia: Secondary | ICD-10-CM | POA: Diagnosis not present

## 2016-10-12 DIAGNOSIS — Z780 Asymptomatic menopausal state: Secondary | ICD-10-CM

## 2016-10-12 DIAGNOSIS — E559 Vitamin D deficiency, unspecified: Secondary | ICD-10-CM

## 2016-10-12 DIAGNOSIS — Z1211 Encounter for screening for malignant neoplasm of colon: Secondary | ICD-10-CM | POA: Diagnosis not present

## 2016-10-12 DIAGNOSIS — Z1159 Encounter for screening for other viral diseases: Secondary | ICD-10-CM | POA: Diagnosis not present

## 2016-10-12 DIAGNOSIS — M8580 Other specified disorders of bone density and structure, unspecified site: Secondary | ICD-10-CM | POA: Diagnosis not present

## 2016-10-12 DIAGNOSIS — R7301 Impaired fasting glucose: Secondary | ICD-10-CM

## 2016-10-12 DIAGNOSIS — Z0000 Encounter for general adult medical examination without abnormal findings: Secondary | ICD-10-CM | POA: Insufficient documentation

## 2016-10-12 LAB — COMPREHENSIVE METABOLIC PANEL
ALBUMIN: 4.3 g/dL (ref 3.5–5.2)
ALK PHOS: 67 U/L (ref 39–117)
ALT: 19 U/L (ref 0–35)
AST: 24 U/L (ref 0–37)
BUN: 24 mg/dL — AB (ref 6–23)
CO2: 29 mEq/L (ref 19–32)
CREATININE: 1.17 mg/dL (ref 0.40–1.20)
Calcium: 9.7 mg/dL (ref 8.4–10.5)
Chloride: 102 mEq/L (ref 96–112)
GFR: 48.6 mL/min — ABNORMAL LOW (ref >60.00)
GLUCOSE: 105 mg/dL — AB (ref 70–99)
POTASSIUM: 4.6 meq/L (ref 3.5–5.1)
SODIUM: 138 meq/L (ref 135–145)
TOTAL PROTEIN: 7.4 g/dL (ref 6.0–8.3)
Total Bilirubin: 0.8 mg/dL (ref 0.2–1.2)

## 2016-10-12 LAB — LIPID PANEL
CHOLESTEROL: 242 mg/dL — AB (ref 0–200)
HDL: 77.9 mg/dL (ref >39.00)
LDL Cholesterol: 148 mg/dL — ABNORMAL HIGH (ref 0–99)
NONHDL: 164.44
Total CHOL/HDL Ratio: 3
Triglycerides: 84 mg/dL (ref 0.0–149.0)
VLDL: 16.8 mg/dL (ref 0.0–40.0)

## 2016-10-12 LAB — CBC
HEMATOCRIT: 39.8 % (ref 36.0–46.0)
Hemoglobin: 13.5 g/dL (ref 12.0–15.0)
MCHC: 33.8 g/dL (ref 30.0–36.0)
MCV: 89.5 fl (ref 78.0–100.0)
PLATELETS: 275 10*3/uL (ref 150.0–400.0)
RBC: 4.45 Mil/uL (ref 3.87–5.11)
RDW: 13.2 % (ref 11.5–15.5)
WBC: 7.1 10*3/uL (ref 4.0–10.5)

## 2016-10-12 LAB — VITAMIN D 25 HYDROXY (VIT D DEFICIENCY, FRACTURES): VITD: 42.39 ng/mL (ref 30.00–100.00)

## 2016-10-12 LAB — HEMOGLOBIN A1C: HEMOGLOBIN A1C: 5.8 % (ref 4.6–6.5)

## 2016-10-12 LAB — TSH: TSH: 2.47 u[IU]/mL (ref 0.35–4.50)

## 2016-10-12 LAB — HEPATITIS C ANTIBODY: HCV AB: NEGATIVE

## 2016-10-12 NOTE — Progress Notes (Signed)
Pre visit review using our clinic review tool, if applicable. No additional management support is needed unless otherwise documented below in the visit note. 

## 2016-10-12 NOTE — Assessment & Plan Note (Signed)
minimize simple carbs. Increase exercise as tolerated.  

## 2016-10-12 NOTE — Assessment & Plan Note (Signed)
Check Vitamin D level today, taking 1000 IU daily

## 2016-10-12 NOTE — Progress Notes (Signed)
Patient ID: Catherine Prince, female   DOB: 27-Feb-1946, 71 y.o.   MRN: [id number]   Subjective:  I acted as a Education administrator for Penni Homans, [place], [place]   Patient ID: Catherine Prince, female    DOB: 01-17-1946, 71 y.o.   MRN: [id number]  Chief Complaint  Patient presents with  . Medicare Wellness    with RN  . Colon Cancer Screening    Would like to discuss her options    HPI  Patient is in today for Medicare Wellness Visit with the Health Coach to be followed up with the Provider. Patient wishes to discuss Colonoscopy vs. Cologuard. Patient has a Hx of osteopenia, hyperlipidemia, diverticulosis. Patient has no acute concerns to note at this time. She denies any recent febrile illness or hospitalization. She maintains a heart healthy diet and exercises regularly. Denies CP/palp/SOB/HA/congestion/fevers/GI or GU c/o. Taking meds as prescribed  Patient Care Team: [name], MD as PCP - General (Family Medicine) Nicole Kindred, MD as Referring Physician (Orthopedic Surgery)   Past Medical History:  Diagnosis Date  . Allergy    mild seasonal allergies  . Cataract    Left eye  . Cataract    Left eye  . Diverticulitis   . Diverticulosis of colon without hemorrhage 12/09/2014  . H/O diverticulitis of colon 12/09/2014   7.5 inches removed after rupture in 2010, has done well since Also rook appendix and 1 ovary  . History of chicken pox   . Hyperlipidemia   . Impaired fasting glucose 12/09/2014  . Macular hole of left eye   . Measles   . Mumps   . Nocturia 12/09/2014  . Osteoarthritis 12/09/2014  . Osteopenia 04/01/2015  . Personal history of cardiac arrhythmia 03/30/2016  . Vitamin D deficiency     Past Surgical History:  Procedure Laterality Date  . APPENDECTOMY    . CATARACT EXTRACTION Left   . COLON SURGERY  2010   pt. reports intestines burst  . EYE SURGERY     Left eye, macular hole, Dr Manson Allan, at [facility] in [place]     07/2014 RT Total Knee  Replacement; 2015, LKR  . REPLACEMENT TOTAL KNEE Left    09/2013  . RIGHT OOPHORECTOMY Right    during Diverticular surgery    Family History  Problem Relation Age of Onset  . Heart disease Father 39  . Thyroid disease Mother   . Diabetes Mother     developed in her 53's  . Dementia Mother     mini strokes  . Heart disease Maternal Grandmother   . Cancer Maternal Grandfather     prostate  . Heart disease Paternal Grandmother   . Heart disease Paternal Grandfather     Social History   Social History  . Marital status: Married    Spouse name: N/A  . Number of children: N/A  . Years of education: 16   Occupational History  . Web designer at [employer]  . Smoking status: Never Smoker  . Smokeless tobacco: Never Used  . Alcohol use 0.0 oz/week     Comment: occassional of wine  . Drug use: No  . Sexual activity: Yes     Comment: lives with husband, works with her church, no dietary restrictions   Other Topics Concern  . Not on file   Social History Narrative  . No narrative on file    Outpatient Medications Prior to Visit  Medication Sig Dispense Refill  . Acetaminophen (TYLENOL EXTRA STRENGTH PO) Take by mouth as needed.    . Biotin 1000 MCG tablet Take 1,000 mcg by mouth daily.    . Multiple Vitamins-Minerals (MULTIVITAMIN ADULT PO) Take 1 tablet by mouth daily.    Marland Kitchen VITAMIN D, CHOLECALCIFEROL, PO Take 200 Units by mouth daily. Pt. Reported     No facility-administered medications prior to visit.     No Known Allergies  Review of Systems  Constitutional: Negative for fever and malaise/fatigue.  HENT: Negative for congestion.   Eyes: Negative for blurred vision.  Respiratory: Negative for cough and shortness of breath.   Cardiovascular: Negative for chest pain, palpitations and leg swelling.  Gastrointestinal: Negative for vomiting.  Musculoskeletal: Negative for back pain.  Skin: Negative for rash.  Neurological:  Negative for loss of consciousness and headaches.       Objective:    Physical Exam  Constitutional: She is oriented to person, place, and time. She appears well-developed and well-nourished. No distress.  HENT:  Head: Normocephalic and atraumatic.  Eyes: Conjunctivae are normal.  Neck: Normal range of motion. No thyromegaly present.  Cardiovascular: Normal rate and regular rhythm.   Pulmonary/Chest: Effort normal and breath sounds normal. She has no wheezes.  Abdominal: Soft. Bowel sounds are normal. She exhibits no distension and no mass. There is no tenderness. There is no rebound and no guarding.  Musculoskeletal: She exhibits no edema or deformity.  Neurological: She is alert and oriented to person, place, and time.  Skin: Skin is warm and dry. She is not diaphoretic.  Psychiatric: She has a normal mood and affect.    BP 120/68 (BP Location: Right Arm, Patient Position: Sitting, Cuff Size: Normal)   Pulse 75   Ht 5\' 2"  (1.575 m)   Wt 135 lb (61.2 kg)   SpO2 98%   BMI 24.69 kg/m  Wt Readings from Last 3 Encounters:  10/12/16 135 lb (61.2 kg)  04/20/16 138 lb 12.8 oz (63 kg)  03/30/16 138 lb (62.6 kg)   BP Readings from Last 3 Encounters:  10/12/16 120/68  04/20/16 134/72  03/30/16 122/66     Immunization History  Administered Date(s) Administered  . Influenza, High Dose Seasonal PF 03/25/2015, 03/30/2016  . Pneumococcal Conjugate-13 02/26/2014  . Pneumococcal Polysaccharide-23 03/30/2016  . Tdap 02/26/2014  . Zoster 08/27/2011    Health Maintenance  Topic Date Due  . COLONOSCOPY  07/02/2016  . INFLUENZA VACCINE  01/30/2017  . MAMMOGRAM  09/12/2017  . TETANUS/TDAP  02/27/2024  . DEXA SCAN  Completed  . Hepatitis C Screening  Completed  . PNA vac Low Risk Adult  Completed    Lab Results  Component Value Date   WBC 7.1 10/12/2016   HGB 13.5 10/12/2016   HCT 39.8 10/12/2016   PLT 275.0 10/12/2016   GLUCOSE 105 (H) 10/12/2016   CHOL 242 (H) 10/12/2016     TRIG 84.0 10/12/2016   HDL 77.90 10/12/2016   LDLCALC 148 (H) 10/12/2016   ALT 19 10/12/2016   AST 24 10/12/2016   NA 138 10/12/2016   K 4.6 10/12/2016   CL 102 10/12/2016   CREATININE 1.17 10/12/2016   BUN 24 (H) 10/12/2016   CO2 29 10/12/2016   TSH 2.47 10/12/2016   HGBA1C 5.8 10/12/2016    Lab Results  Component Value Date   TSH 2.47 10/12/2016   Lab Results  Component Value Date   WBC 7.1 10/12/2016   HGB 13.5 10/12/2016  HCT 39.8 10/12/2016   MCV 89.5 10/12/2016   PLT 275.0 10/12/2016   Lab Results  Component Value Date   NA 138 10/12/2016   K 4.6 10/12/2016   CO2 29 10/12/2016   GLUCOSE 105 (H) 10/12/2016   BUN 24 (H) 10/12/2016   CREATININE 1.17 10/12/2016   BILITOT 0.8 10/12/2016   ALKPHOS 67 10/12/2016   AST 24 10/12/2016   ALT 19 10/12/2016   PROT 7.4 10/12/2016   ALBUMIN 4.3 10/12/2016   CALCIUM 9.7 10/12/2016   GFR 48.60 (L) 10/12/2016   Lab Results  Component Value Date   CHOL 242 (H) 10/12/2016   Lab Results  Component Value Date   HDL 77.90 10/12/2016   Lab Results  Component Value Date   LDLCALC 148 (H) 10/12/2016   Lab Results  Component Value Date   TRIG 84.0 10/12/2016   Lab Results  Component Value Date   CHOLHDL 3 10/12/2016   Lab Results  Component Value Date   HGBA1C 5.8 10/12/2016         Assessment & Plan:   Problem List Items Addressed This Visit    Hyperlipidemia    Encouraged heart healthy diet, increase exercise, avoid trans fats, consider a krill oil cap daily      Relevant Orders   Lipid panel (Completed)   Vitamin D deficiency    Check Vitamin D level today, taking 1000 IU daily      Relevant Orders   VITAMIN D 25 Hydroxy (Vit-D Deficiency, Fractures) (Completed)   Impaired fasting glucose    minimize simple carbs. Increase exercise as tolerated.       Relevant Orders   Hemoglobin A1c (Completed)   Osteopenia    Encouraged to get adequate exercise, calcium and vitamin d intake       Colon cancer screening    Patient ready for colonoscopy, referral placed      Relevant Orders   Ambulatory referral to Gastroenterology    Other Visit Diagnoses    Postmenopausal    -  Primary   Relevant Orders   MM DIGITAL SCREENING BILATERAL   Encounter for Medicare annual wellness exam       Relevant Orders   CBC (Completed)   VITAMIN D 25 Hydroxy (Vit-D Deficiency, Fractures) (Completed)   TSH (Completed)   Lipid panel (Completed)   Comprehensive metabolic panel (Completed)   Encounter for hepatitis C screening test for low risk patient       Relevant Orders   Hepatitis C antibody (Completed)      I am having Ms. Ostrom maintain her Multiple Vitamins-Minerals (MULTIVITAMIN ADULT PO), (VITAMIN D, CHOLECALCIFEROL, PO), Biotin, Acetaminophen (TYLENOL EXTRA STRENGTH PO), and DIGESTIVE ADVANTAGE.  Meds ordered this encounter  Medications  . Probiotic Product (DIGESTIVE ADVANTAGE) CAPS    CMA served as scribe during this visit. History, Physical and Plan performed by medical provider. Documentation and orders reviewed and attested to.  Penni Homans, MD

## 2016-10-12 NOTE — Assessment & Plan Note (Signed)
Encouraged to get adequate exercise, calcium and vitamin d intake 

## 2016-10-12 NOTE — Assessment & Plan Note (Addendum)
Patient ready for colonoscopy, referral placed

## 2016-10-12 NOTE — Patient Instructions (Addendum)
Schedule mammogram and bone density. Spoke with you today in regards to [place]. Encouraged to you bring a notarized copy of your Living Will and Healthcare Power of Attorney into Dr. Frederik Pear Office to be scanned into your Chart. Encouraged to incorporate 1200-1500 (3 servings) of calicum. Apply Salonpas, Apspercreme, Icy Hot (with Lidocaine) to the affected area as needed. Preventive Care 56 Years and Older, Female Preventive care refers to lifestyle choices and visits with your health care provider that can promote health and wellness. What does preventive care include?  A yearly physical exam. This is also called an annual well check.  Dental exams once or twice a year.  Routine eye exams. Ask your health care provider how often you should have your eyes checked.  Personal lifestyle choices, including:  Daily care of your teeth and gums.  Regular physical activity.  Eating a healthy diet.  Avoiding tobacco and drug use.  Limiting alcohol use.  Practicing safe sex.  Taking low-dose aspirin every day.  Taking vitamin and mineral supplements as recommended by your health care provider. What happens during an annual well check? The services and screenings done by your health care provider during your annual well check will depend on your age, overall health, lifestyle risk factors, and family history of disease. Counseling  Your health care provider may ask you questions about your:  Alcohol use.  Tobacco use.  Drug use.  Emotional well-being.  Home and relationship well-being.  Sexual activity.  Eating habits.  History of falls.  Memory and ability to understand (cognition).  Work and work Statistician.  Reproductive health. Screening  You may have the following tests or measurements:  Height, weight, and BMI.  Blood pressure.  Lipid and cholesterol levels. These may be checked every 5 years, or more frequently if you are over 34 years  old.  Skin check.  Lung cancer screening. You may have this screening every year starting at age 3 if you have a 30-pack-year history of smoking and currently smoke or have quit within the past 15 years.  Fecal occult blood test (FOBT) of the stool. You may have this test every year starting at age 6.  Flexible sigmoidoscopy or colonoscopy. You may have a sigmoidoscopy every 5 years or a colonoscopy every 10 years starting at age 77.  Hepatitis C blood test.  Hepatitis B blood test.  Sexually transmitted disease (STD) testing.  Diabetes screening. This is done by checking your blood sugar (glucose) after you have not eaten for a while (fasting). You may have this done every 1-3 years.  Bone density scan. This is done to screen for osteoporosis. You may have this done starting at age 31.  Mammogram. This may be done every 1-2 years. Talk to your health care provider about how often you should have regular mammograms. Talk with your health care provider about your test results, treatment options, and if necessary, the need for more tests. Vaccines  Your health care provider may recommend certain vaccines, such as:  Influenza vaccine. This is recommended every year.  Tetanus, diphtheria, and acellular pertussis (Tdap, Td) vaccine. You may need a Td booster every 10 years.  Varicella vaccine. You may need this if you have not been vaccinated.  Zoster vaccine. You may need this after age 11.  Measles, mumps, and rubella (MMR) vaccine. You may need at least one dose of MMR if you were born in 1957 or later. You may also need a second dose.  Pneumococcal 13-valent  conjugate (PCV13) vaccine. One dose is recommended after age 55.  Pneumococcal polysaccharide (PPSV23) vaccine. One dose is recommended after age 64.  Meningococcal vaccine. You may need this if you have certain conditions.  Hepatitis A vaccine. You may need this if you have certain conditions or if you travel or work in  places where you may be exposed to hepatitis A.  Hepatitis B vaccine. You may need this if you have certain conditions or if you travel or work in places where you may be exposed to hepatitis B.  Haemophilus influenzae type b (Hib) vaccine. You may need this if you have certain conditions. Talk to your health care provider about which screenings and vaccines you need and how often you need them. This information is not intended to replace advice given to you by your health care provider. Make sure you discuss any questions you have with your health care provider. Document Released: 07/15/2015 Document Revised: 03/07/2016 Document Reviewed: 04/19/2015 Elsevier Interactive Patient Education  2017 [employer].

## 2016-10-12 NOTE — Assessment & Plan Note (Signed)
Encouraged heart healthy diet, increase exercise, avoid trans fats, consider a krill oil cap daily 

## 2016-10-12 NOTE — Assessment & Plan Note (Signed)
Worst in right hand knuckle in 2nd knuckle that is hypertrophied and is doing

## 2016-10-17 ENCOUNTER — Encounter: Payer: Self-pay | Admitting: Family Medicine

## 2016-10-29 ENCOUNTER — Other Ambulatory Visit: Payer: Self-pay

## 2016-10-29 DIAGNOSIS — M8580 Other specified disorders of bone density and structure, unspecified site: Secondary | ICD-10-CM

## 2016-10-30 ENCOUNTER — Encounter (HOSPITAL_BASED_OUTPATIENT_CLINIC_OR_DEPARTMENT_OTHER): Payer: Self-pay

## 2016-10-30 ENCOUNTER — Ambulatory Visit (HOSPITAL_BASED_OUTPATIENT_CLINIC_OR_DEPARTMENT_OTHER)
Admission: RE | Admit: 2016-10-30 | Discharge: 2016-10-30 | Disposition: A | Discharge status: Home / Self Care | Payer: Medicare HMO | Source: Ambulatory Visit | Attending: Family Medicine | Admitting: Family Medicine

## 2016-10-30 ENCOUNTER — Other Ambulatory Visit: Payer: Self-pay | Admitting: Family Medicine

## 2016-10-30 DIAGNOSIS — M85851 Other specified disorders of bone density and structure, right thigh: Secondary | ICD-10-CM | POA: Insufficient documentation

## 2016-10-30 DIAGNOSIS — Z780 Asymptomatic menopausal state: Secondary | ICD-10-CM

## 2016-10-30 DIAGNOSIS — Z1239 Encounter for other screening for malignant neoplasm of breast: Secondary | ICD-10-CM | POA: Diagnosis not present

## 2016-10-30 DIAGNOSIS — E559 Vitamin D deficiency, unspecified: Secondary | ICD-10-CM | POA: Insufficient documentation

## 2016-10-30 DIAGNOSIS — Z1231 Encounter for screening mammogram for malignant neoplasm of breast: Secondary | ICD-10-CM | POA: Diagnosis not present

## 2016-10-30 DIAGNOSIS — M8588 Other specified disorders of bone density and structure, other site: Secondary | ICD-10-CM | POA: Diagnosis not present

## 2016-10-30 DIAGNOSIS — M8580 Other specified disorders of bone density and structure, unspecified site: Secondary | ICD-10-CM

## 2016-11-13 ENCOUNTER — Encounter: Payer: Self-pay | Admitting: Gastroenterology

## 2016-11-13 ENCOUNTER — Encounter: Payer: Self-pay | Admitting: Family Medicine

## 2016-12-13 ENCOUNTER — Ambulatory Visit (INDEPENDENT_AMBULATORY_CARE_PROVIDER_SITE_OTHER): Payer: Medicare HMO | Admitting: Gastroenterology

## 2016-12-13 ENCOUNTER — Encounter: Payer: Self-pay | Admitting: Gastroenterology

## 2016-12-13 VITALS — BP 124/74 | HR 68 | Ht 62.0 in | Wt 138.6 lb

## 2016-12-13 DIAGNOSIS — Z1211 Encounter for screening for malignant neoplasm of colon: Secondary | ICD-10-CM

## 2016-12-13 DIAGNOSIS — K5730 Diverticulosis of large intestine without perforation or abscess without bleeding: Secondary | ICD-10-CM

## 2016-12-13 MED ORDER — NA SULFATE-K SULFATE-MG SULF 17.5-3.13-1.6 GM/177ML PO SOLN: 1.0000 | Freq: Once | ORAL | 0 refills | Status: AC

## 2016-12-13 NOTE — Patient Instructions (Signed)
If you are age 70 or older, your body mass index should be between 23-30. Your Body mass index is 25.35 kg/m. If this is out of the aforementioned range listed, please consider follow up with your Primary Care Provider.  If you are age 109 or younger, your body mass index should be between 19-25. Your Body mass index is 25.35 kg/m. If this is out of the aformentioned range listed, please consider follow up with your Primary Care Provider.   You have been scheduled for a colonoscopy. Please follow written instructions given to you at your visit today.  Please pick up your prep supplies at the pharmacy within the next 1-3 days. If you use inhalers (even only as needed), please bring them with you on the day of your procedure. Your physician has requested that you go to www.startemmi.com and enter the access code given to you at your visit today. This web site gives a general overview about your procedure. However, you should still follow specific instructions given to you by our office regarding your preparation for the procedure.  Thank you for choosing [facility]  Dr Wilfrid Lund III

## 2016-12-13 NOTE — Progress Notes (Signed)
[facility] Consult Note:  History: Catherine Prince 12/13/2016  Referring physician: [name], MD  Reason for consult/chief complaint: Colon Cancer Screening (discuss colonoscopy; last colonoscopy was 10 years ago; has had previous perforated diverticulum and was concerned about colon after that; no current gi symptoms)   Subjective  HPI:  Catherine Prince sees me to discuss a screening colonoscopy. She reports having had one about 10 years ago no polyps were found. This seems to been done at an outside clinic since no records available in the Epic system. She had partial sigmoid colectomy in 2010 for perforated diverticulitis. She has done well since then, with no recurrent episodes of diverticulitis, no constipation or rectal bleeding. She denies abdominal pain, anorexia, weight loss, chronic heartburn nausea or vomiting.  She wanted to discuss the safety of performing a colonoscopy considering her prior surgery.  ROS:  Review of Systems  She denies chest pain or dyspnea.  Past Medical History: Past Medical History:  Diagnosis Date  . Allergy    mild seasonal allergies  . Arthritis   . Cataract    Left eye  . Diverticulitis   . Diverticulosis of colon without hemorrhage 12/09/2014  . H/O diverticulitis of colon 12/09/2014   7.5 inches removed after rupture in 2010, has done well since Also rook appendix and 1 ovary  . History of chicken pox   . Hyperlipidemia   . Impaired fasting glucose 12/09/2014  . Macular hole of left eye   . Measles   . Mumps   . Nocturia 12/09/2014  . Osteoarthritis 12/09/2014  . Osteopenia 04/01/2015  . Personal history of cardiac arrhythmia 03/30/2016  . Vitamin D deficiency      Past Surgical History: Past Surgical History:  Procedure Laterality Date  . APPENDECTOMY    . BREAST CYST ASPIRATION    . CATARACT EXTRACTION Left   . COLON SURGERY  2010   pt. reports intestines burst  . EYE SURGERY     Left eye, retinal hole, Dr  Manson Allan, at [facility] in [place] Left    09/2013  . REPLACEMENT TOTAL KNEE Right 2016  . RIGHT OOPHORECTOMY Right    during Diverticular surgery     Family History: Family History  Problem Relation Age of Onset  . Heart disease Father 47  . Thyroid disease Mother   . Diabetes Mother        developed in her 77's  . Dementia Mother        mini strokes  . Heart disease Maternal Grandmother   . Prostate cancer Maternal Grandfather   . Heart disease Paternal Grandmother   . Heart disease Paternal Grandfather   . Colon cancer Neg Hx   . Esophageal cancer Neg Hx   . Stomach cancer Neg Hx   . Pancreatic cancer Neg Hx     Social History: Social History   Social History  . Marital status: Married    Spouse name: N/A  . Number of children: N/A  . Years of education: 23   Occupational History  . Web designer at [employer]  . Smoking status: Never Smoker  . Smokeless tobacco: Never Used  . Alcohol use 0.0 oz/week     Comment: occassional of wine  . Drug use: No  . Sexual activity: Yes     Comment: lives with husband, works with her church, no dietary restrictions   Other Topics Concern  .  None   Social History Narrative  . 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 Acetaminophen (TYLENOL EXTRA STRENGTH PO) Take by mouth as needed.    . Biotin 1000 MCG tablet Take 1,000 mcg by mouth daily.    . Multiple Vitamins-Minerals (MULTIVITAMIN ADULT PO) Take 1 tablet by mouth daily.    . Probiotic Product (DIGESTIVE ADVANTAGE) CAPS 1 capsule daily.     Marland Kitchen VITAMIN D, CHOLECALCIFEROL, PO Take 200 Units by mouth daily. Pt. Reported    . Na Sulfate-K Sulfate-Mg Sulf 17.5-3.13-1.6 GM/180ML SOLN Take 1 kit by mouth once. 354 mL 0   No current  facility-administered medications for this visit.       ___________________________________________________________________ Objective   Exam:  BP 124/74   Pulse 68   Ht '5\' 2"'  (1.575 m)   Wt 138 lb 9.6 oz (62.9 kg)   BMI 25.35 kg/m    General: this is a(n) Well-appearing woman, good muscle mass   Eyes: sclera anicteric, no redness  ENT: oral mucosa moist without lesions, no cervical or supraclavicular lymphadenopathy, good dentition  CV: RRR without murmur, S1/S2, no JVD, no peripheral edema  Resp: clear to auscultation bilaterally, normal RR and effort noted  GI: soft, no tenderness, with active bowel sounds. No guarding or palpable organomegaly noted.  Skin; warm and dry, no rash or jaundice noted  Neuro: awake, alert and oriented x 3. Normal gross motor function and fluent speech    Assessment: Encounter Diagnoses  Name Primary?  . Special screening for malignant neoplasms, colon Yes  . Diverticulosis of colon without hemorrhage     She is a good candidate for screening colonoscopy, at average risk for colorectal cancer. I do not expect that she is at any increased risk of complications because of her prior diverticulitis surgery. We discussed the nature of diverticulitis and particularly the fact that older dietary recommendations have largely been discontinued at this point. We discussed the nature of the colonoscopy, the typical bowel preparation, risks and benefits of the procedure.  Plan:  Colonoscopy scheduled  The benefits and risks of the planned procedure were described in detail with the patient or (when appropriate) their health care proxy.  Risks were outlined as including, but not limited to, bleeding, infection, perforation, adverse medication reaction leading to cardiac or pulmonary decompensation, or pancreatitis (if ERCP).  The limitation of incomplete mucosal visualization was also discussed.  No guarantees or warranties were given.  Total 20  minute visit, over half spent history, counseling and coordinate and care. Topics discussed as above.  Thank you for the courtesy of this consult.  Please call me with any questions or concerns.  Nelida Meuse III  CC: [name], MD

## 2016-12-20 ENCOUNTER — Telehealth: Payer: Self-pay

## 2016-12-20 ENCOUNTER — Encounter: Payer: Self-pay | Admitting: Gastroenterology

## 2016-12-20 NOTE — Telephone Encounter (Signed)
Patient is on the list for [employer] 2018 and may be a good candidate for an AWV. Please let me know if/when appt is scheduled.   

## 2016-12-21 NOTE — Telephone Encounter (Signed)
Patient completed awv on October 12, 2016.

## 2017-01-03 ENCOUNTER — Encounter: Payer: Self-pay | Admitting: Gastroenterology

## 2017-01-03 ENCOUNTER — Ambulatory Visit (AMBULATORY_SURGERY_CENTER): Payer: Medicare HMO | Admitting: Gastroenterology

## 2017-01-03 VITALS — BP 104/59 | HR 69 | Temp 97.1°F | Resp 14 | Ht 62.0 in | Wt 138.0 lb

## 2017-01-03 DIAGNOSIS — D120 Benign neoplasm of cecum: Secondary | ICD-10-CM

## 2017-01-03 DIAGNOSIS — D124 Benign neoplasm of descending colon: Secondary | ICD-10-CM | POA: Diagnosis not present

## 2017-01-03 DIAGNOSIS — Z1211 Encounter for screening for malignant neoplasm of colon: Secondary | ICD-10-CM | POA: Diagnosis not present

## 2017-01-03 DIAGNOSIS — Z1212 Encounter for screening for malignant neoplasm of rectum: Secondary | ICD-10-CM

## 2017-01-03 MED ORDER — SODIUM CHLORIDE 0.9 % IV SOLN: 500.0000 mL | INTRAVENOUS | Status: DC

## 2017-01-03 NOTE — Progress Notes (Signed)
Alert and oriented x3, pleased with MAC, report to RN  

## 2017-01-03 NOTE — Op Note (Signed)
[facility] Patient Name: Catherine Prince Procedure Date: 01/03/2017 11:59 AM MRN: [id number] Endoscopist: Mallie Mussel L. [name] , MD Age: 71 Referring MD:  Date of Birth: July 05, 1945 Gender: Female Account #: [id number] Procedure:                Colonoscopy Indications:              Screening for colorectal malignant neoplasm Medicines:                Monitored Anesthesia Care Procedure:                Pre-Anesthesia Assessment:                           - Prior to the procedure, a History and Physical                            was performed, and patient medications and                            allergies were reviewed. The patient's tolerance of                            previous anesthesia was also reviewed. The risks                            and benefits of the procedure and the sedation                            options and risks were discussed with the patient.                            All questions were answered, and informed consent                            was obtained. Prior Anticoagulants: The patient has                            taken no previous anticoagulant or antiplatelet                            agents. ASA Grade Assessment: II - A patient with                            mild systemic disease. After reviewing the risks                            and benefits, the patient was deemed in                            satisfactory condition to undergo the procedure.                           After obtaining informed consent, the colonoscope  was passed under direct vision. Throughout the                            procedure, the patient's blood pressure, pulse, and                            oxygen saturations were monitored continuously. The                            Colonoscope was introduced through the anus and                            advanced to the the cecum, identified by                            appendiceal orifice and  ileocecal valve. The                            colonoscopy was performed without difficulty. The                            patient tolerated the procedure well. The quality                            of the bowel preparation was excellent. The                            ileocecal valve, appendiceal orifice, and rectum                            were photographed. The quality of the bowel                            preparation was evaluated using the BBPS [facility]                            Bowel Preparation Scale) with scores of: Right                            Colon = 3, Transverse Colon = 3 and Left Colon = 3                            (entire mucosa seen well with no residual staining,                            small fragments of stool or opaque liquid). The                            total BBPS score equals 9. The bowel preparation                            used was SUPREP. Scope In: 12:02:53 PM Scope Out: 12:13:35 PM Scope Withdrawal Time: 0 hours 7  minutes 43 seconds  Total Procedure Duration: 0 hours 10 minutes 42 seconds  Findings:                 The perianal and digital rectal examinations were                            normal.                           Two sessile polyps were found in the descending                            colon and cecum. The polyps were 1 mm in size.                            These polyps were removed with a cold biopsy                            forceps. Resection and retrieval were complete.                           A few small-mouthed diverticula were found in the                            descending colon.                           There was evidence of a prior end-to-end                            colo-colonic anastomosis in the rectum. This was                            patent and was characterized by healthy appearing                            mucosa. the sigmoid colon had been resected.                           The exam was otherwise  without abnormality on                            direct and retroflexion views. Complications:            No immediate complications. Estimated Blood Loss:     Estimated blood loss: none. Impression:               - Two 1 mm polyps in the descending colon and in                            the cecum, removed with a cold biopsy forceps.                            Resected and retrieved.                           -  Diverticulosis in the descending colon.                           - Patent end-to-end colo-colonic anastomosis,                            characterized by healthy appearing mucosa.                           - The examination was otherwise normal on direct                            and retroflexion views. Recommendation:           - Patient has a contact number available for                            emergencies. The signs and symptoms of potential                            delayed complications were discussed with the                            patient. Return to normal activities tomorrow.                            Written discharge instructions were provided to the                            patient.                           - Resume previous diet.                           - Continue present medications.                           - Await pathology results.                           - Repeat colonoscopy is recommended for                            surveillance. The colonoscopy date will be                            determined after pathology results from today's                            exam become available for review. Henry L. [name], MD 01/03/2017 12:18:22 PM This report has been signed electronically.

## 2017-01-03 NOTE — Progress Notes (Signed)
Pt's states no medical or surgical changes since previsit or office visit. 

## 2017-01-03 NOTE — Progress Notes (Signed)
Called to room to assist during endoscopic procedure.  Patient ID and intended procedure confirmed with present staff. Received instructions for my participation in the procedure from the performing physician.  

## 2017-01-03 NOTE — Patient Instructions (Signed)
Handout given on polyps  YOU HAD AN ENDOSCOPIC PROCEDURE TODAY: Refer to the procedure report and other information in the discharge instructions given to you for any specific questions about what was found during the examination. If this information does not answer your questions, please call [place] office at [phone] to clarify.   YOU SHOULD EXPECT: Some feelings of bloating in the abdomen. Passage of more gas than usual. Walking can help get rid of the air that was put into your GI tract during the procedure and reduce the bloating. If you had a lower endoscopy (such as a colonoscopy or flexible sigmoidoscopy) you may notice spotting of blood in your stool or on the toilet paper. Some abdominal soreness may be present for a day or two, also.  DIET: Your first meal following the procedure should be a light meal and then it is ok to progress to your normal diet. A half-sandwich or bowl of soup is an example of a good first meal. Heavy or fried foods are harder to digest and may make you feel nauseous or bloated. Drink plenty of fluids but you should avoid alcoholic beverages for 24 hours. If you had a esophageal dilation, please see attached instructions for diet.    ACTIVITY: Your care partner should take you home directly after the procedure. You should plan to take it easy, moving slowly for the rest of the day. You can resume normal activity the day after the procedure however YOU SHOULD NOT DRIVE, use power tools, machinery or perform tasks that involve climbing or major physical exertion for 24 hours (because of the sedation medicines used during the test).   SYMPTOMS TO REPORT IMMEDIATELY: A gastroenterologist can be reached at any hour. Please call [phone]  for any of the following symptoms:  Following lower endoscopy (colonoscopy, flexible sigmoidoscopy) Excessive amounts of blood in the stool  Significant tenderness, worsening of abdominal pains  Swelling of the abdomen that is  new, acute  Fever of 100 or higher    FOLLOW UP:  If any biopsies were taken you will be contacted by phone or by letter within the next 1-3 weeks. Call [phone]  if you have not heard about the biopsies in 3 weeks.  Please also call with any specific questions about appointments or follow up tests.  

## 2017-01-04 ENCOUNTER — Telehealth: Payer: Self-pay | Admitting: *Deleted

## 2017-01-04 NOTE — Telephone Encounter (Signed)
  Follow up Call-  Call back number 01/03/2017  Post procedure Call Back phone  # [phone]  Permission to leave phone message Yes  Some recent data might be hidden     Patient questions:  Do you have a fever, pain , or abdominal swelling? No. Pain Score  0 *  Have you tolerated food without any problems? Yes.    Have you been able to return to your normal activities? Yes.    Do you have any questions about your discharge instructions: Diet   No. Medications  No. Follow up visit  No.  Do you have questions or concerns about your Care? No.  Actions: * If pain score is 4 or above: No action needed, pain <4.

## 2017-01-10 ENCOUNTER — Encounter: Payer: Self-pay | Admitting: Gastroenterology

## 2017-01-25 DIAGNOSIS — Z0000 Encounter for general adult medical examination without abnormal findings: Secondary | ICD-10-CM | POA: Diagnosis not present

## 2017-01-25 DIAGNOSIS — Z8739 Personal history of other diseases of the musculoskeletal system and connective tissue: Secondary | ICD-10-CM | POA: Diagnosis not present

## 2017-01-25 DIAGNOSIS — R69 Illness, unspecified: Secondary | ICD-10-CM | POA: Diagnosis not present

## 2017-04-04 DIAGNOSIS — R69 Illness, unspecified: Secondary | ICD-10-CM | POA: Diagnosis not present

## 2017-07-18 DIAGNOSIS — H2511 Age-related nuclear cataract, right eye: Secondary | ICD-10-CM | POA: Diagnosis not present

## 2017-07-18 DIAGNOSIS — H43811 Vitreous degeneration, right eye: Secondary | ICD-10-CM | POA: Diagnosis not present

## 2017-08-01 DIAGNOSIS — H43811 Vitreous degeneration, right eye: Secondary | ICD-10-CM | POA: Diagnosis not present

## 2017-08-02 HISTORY — PX: BLEPHAROPLASTY: SUR158

## 2017-08-06 DIAGNOSIS — H02411 Mechanical ptosis of right eyelid: Secondary | ICD-10-CM | POA: Diagnosis not present

## 2017-08-06 DIAGNOSIS — H02412 Mechanical ptosis of left eyelid: Secondary | ICD-10-CM | POA: Diagnosis not present

## 2017-08-07 DIAGNOSIS — Z01818 Encounter for other preprocedural examination: Secondary | ICD-10-CM | POA: Diagnosis not present

## 2017-08-07 DIAGNOSIS — H02411 Mechanical ptosis of right eyelid: Secondary | ICD-10-CM | POA: Diagnosis not present

## 2017-08-07 DIAGNOSIS — H02412 Mechanical ptosis of left eyelid: Secondary | ICD-10-CM | POA: Diagnosis not present

## 2017-08-26 DIAGNOSIS — H02411 Mechanical ptosis of right eyelid: Secondary | ICD-10-CM | POA: Diagnosis not present

## 2017-08-26 DIAGNOSIS — H02412 Mechanical ptosis of left eyelid: Secondary | ICD-10-CM | POA: Diagnosis not present

## 2017-08-26 DIAGNOSIS — H02413 Mechanical ptosis of bilateral eyelids: Secondary | ICD-10-CM | POA: Diagnosis not present

## 2017-10-14 ENCOUNTER — Ambulatory Visit: Payer: Medicare HMO | Admitting: *Deleted

## 2017-10-17 ENCOUNTER — Encounter: Payer: Self-pay | Admitting: *Deleted

## 2017-10-17 ENCOUNTER — Ambulatory Visit (INDEPENDENT_AMBULATORY_CARE_PROVIDER_SITE_OTHER): Payer: Medicare HMO | Admitting: *Deleted

## 2017-10-17 ENCOUNTER — Ambulatory Visit: Payer: Medicare HMO | Admitting: *Deleted

## 2017-10-17 ENCOUNTER — Ambulatory Visit (INDEPENDENT_AMBULATORY_CARE_PROVIDER_SITE_OTHER): Payer: Medicare HMO | Admitting: Family Medicine

## 2017-10-17 VITALS — BP 120/66 | HR 73 | Ht 62.0 in | Wt 139.0 lb

## 2017-10-17 DIAGNOSIS — L578 Other skin changes due to chronic exposure to nonionizing radiation: Secondary | ICD-10-CM

## 2017-10-17 DIAGNOSIS — E782 Mixed hyperlipidemia: Secondary | ICD-10-CM | POA: Diagnosis not present

## 2017-10-17 DIAGNOSIS — Z0000 Encounter for general adult medical examination without abnormal findings: Secondary | ICD-10-CM

## 2017-10-17 DIAGNOSIS — R7301 Impaired fasting glucose: Secondary | ICD-10-CM | POA: Diagnosis not present

## 2017-10-17 DIAGNOSIS — C4491 Basal cell carcinoma of skin, unspecified: Secondary | ICD-10-CM

## 2017-10-17 DIAGNOSIS — E559 Vitamin D deficiency, unspecified: Secondary | ICD-10-CM

## 2017-10-17 LAB — COMPREHENSIVE METABOLIC PANEL
ALBUMIN: 4.2 g/dL (ref 3.5–5.2)
ALT: 16 U/L (ref 0–35)
AST: 20 U/L (ref 0–37)
Alkaline Phosphatase: 61 U/L (ref 39–117)
BUN: 30 mg/dL — AB (ref 6–23)
CHLORIDE: 102 meq/L (ref 96–112)
CO2: 28 meq/L (ref 19–32)
CREATININE: 1.11 mg/dL (ref 0.40–1.20)
Calcium: 9.4 mg/dL (ref 8.4–10.5)
GFR: 51.49 mL/min — ABNORMAL LOW (ref >60.00)
Glucose, Bld: 99 mg/dL (ref 70–99)
Potassium: 5 mEq/L (ref 3.5–5.1)
SODIUM: 137 meq/L (ref 135–145)
Total Bilirubin: 0.4 mg/dL (ref 0.2–1.2)
Total Protein: 7.3 g/dL (ref 6.0–8.3)

## 2017-10-17 LAB — LIPID PANEL
CHOL/HDL RATIO: 3
Cholesterol: 213 mg/dL — ABNORMAL HIGH (ref 0–200)
HDL: 77.3 mg/dL (ref >39.00)
LDL CALC: 114 mg/dL — AB (ref 0–99)
NonHDL: 135.98
Triglycerides: 108 mg/dL (ref 0.0–149.0)
VLDL: 21.6 mg/dL (ref 0.0–40.0)

## 2017-10-17 LAB — TSH: TSH: 2.46 u[IU]/mL (ref 0.35–4.50)

## 2017-10-17 LAB — HEMOGLOBIN A1C: Hgb A1c MFr Bld: 5.7 % (ref 4.6–6.5)

## 2017-10-17 LAB — VITAMIN D 25 HYDROXY (VIT D DEFICIENCY, FRACTURES): VITD: 38.91 ng/mL (ref 30.00–100.00)

## 2017-10-17 NOTE — Patient Instructions (Signed)
Follow up with Dr.Blyth today as scheduled  Continue to eat heart healthy diet (full of fruits, vegetables, whole grains, lean protein, water--limit salt, fat, and sugar intake) and increase physical activity as tolerated.  Continue doing brain stimulating activities (puzzles, reading, adult coloring books, staying active) to keep memory sharp.   Bring a copy of your living will and/or healthcare power of attorney to your next office visit.   Catherine Prince , Thank you for taking time to come for your Medicare Wellness Visit. I appreciate your ongoing commitment to your health goals. Please review the following plan we discussed and let me know if I can assist you in the future.   These are the goals we discussed: Goals    . Increase physical activity     * Exercise program at home     . Lose 10-15 lbs by next year.   (pt-stated)       This is a list of the screening recommended for you and due dates:  Health Maintenance  Topic Date Due  . Flu Shot  01/30/2018  . Mammogram  10/31/2018  . Colon Cancer Screening  01/03/2022  . Tetanus Vaccine  02/27/2024  . DEXA scan (bone density measurement)  Completed  .  Hepatitis C: One time screening is recommended by Center for Disease Control  (CDC) for  adults born from [birth year] through 1964.   Completed  . Pneumonia vaccines  Completed

## 2017-10-17 NOTE — Progress Notes (Signed)
Subjective:  I acted as a Education administrator for Dr. Charlett Blake. [place], [place]  Patient ID: Catherine Prince, female    DOB: Nov 23, 1945, 72 y.o.   MRN: [id number]  No chief complaint on file.   HPI  Patient is in today for an annual exam and follow up on chronic medical concerns including Vitamin D deficiency, hyperglycemia, BCC and h/o colonic polyps. She believes it is time for repeat colonoscopy. Her previous colonoscopy was done before she moved here. She denies any abdominal pain, bloody or tarry stool. No polyuria or polydipsia. No recent febrile illness or hospitalizations. Is doing well with activities of daily living. Follows a heart healthy diet and regular exercise. Denies CP/palp/SOB/HA/congestion/fevers/GI or GU c/o. Taking meds as prescribed  Patient Care Team: [name], MD as PCP - General (Family Medicine) Nicole Kindred, MD as Referring Physician (Orthopedic Surgery)   Past Medical History:  Diagnosis Date  . Allergy    mild seasonal allergies  . Arthritis   . Cataract    Left eye  . Diverticulitis   . Diverticulosis of colon without hemorrhage 12/09/2014  . H/O diverticulitis of colon 12/09/2014   7.5 inches removed after rupture in 2010, has done well since Also rook appendix and 1 ovary  . History of chicken pox   . Hyperlipidemia   . Impaired fasting glucose 12/09/2014  . Macular hole of left eye   . Measles   . Mumps   . Nocturia 12/09/2014  . Osteoarthritis 12/09/2014  . Osteopenia 04/01/2015  . Personal history of cardiac arrhythmia 03/30/2016  . Vitamin D deficiency     Past Surgical History:  Procedure Laterality Date  . APPENDECTOMY    . BLEPHAROPLASTY Bilateral 08/02/2017  . BREAST CYST ASPIRATION    . CATARACT EXTRACTION Left   . COLON SURGERY  2010   pt. reports intestines burst  . EYE SURGERY     Left eye, retinal hole, Dr Manson Allan, at [facility] in [place] Left    09/2013  . REPLACEMENT TOTAL KNEE Right 2016  . RIGHT OOPHORECTOMY  Right    during Diverticular surgery    Family History  Problem Relation Age of Onset  . Heart disease Father 41  . Thyroid disease Mother   . Diabetes Mother        developed in her 74's  . Dementia Mother        mini strokes  . Heart disease Maternal Grandmother   . Prostate cancer Maternal Grandfather   . Heart disease Paternal Grandmother   . Heart disease Paternal Grandfather   . Colon cancer Neg Hx   . Esophageal cancer Neg Hx   . Stomach cancer Neg Hx   . Pancreatic cancer Neg Hx     Social History   Socioeconomic History  . Marital status: Married    Spouse name: Not on file  . Number of children: Not on file  . Years of education: 38  . Highest education level: Not on file  Occupational History  . Occupation: Web designer at [employer]  . Financial resource strain: Not on file  . Food insecurity:    Worry: Not on file    Inability: Not on file  . Transportation needs:    Medical: Not on file    Non-medical: Not on file  Tobacco Use  . Smoking status: Never Smoker  . Smokeless tobacco: Never Used  Substance and Sexual Activity  . Alcohol use:  Yes    Alcohol/week: 0.0 oz    Comment: occassional of wine  . Drug use: No  . Sexual activity: Yes    Comment: lives with husband, works with her church, no dietary restrictions  Lifestyle  . Physical activity:    Days per week: Not on file    Minutes per session: Not on file  . Stress: Not on file  Relationships  . Social connections:    Talks on phone: Not on file    Gets together: Not on file    Attends religious service: Not on file    Active member of club or organization: Not on file    Attends meetings of clubs or organizations: Not on file    Relationship status: Not on file  . Intimate partner violence:    Fear of current or ex partner: Not on file    Emotionally abused: Not on file    Physically abused: Not on file    Forced sexual activity: Not on file  Other Topics  Concern  . Not on file  Social History Narrative  . Not on file    Outpatient Medications Prior to Visit  Medication Sig Dispense Refill  . Acetaminophen (TYLENOL EXTRA STRENGTH PO) Take by mouth as needed.    . Biotin 1000 MCG tablet Take 1,000 mcg by mouth daily.    . Multiple Vitamins-Minerals (MULTIVITAMIN ADULT PO) Take 1 tablet by mouth daily.    . Probiotic Product (DIGESTIVE ADVANTAGE) CAPS 1 capsule daily.     Marland Kitchen VITAMIN D, CHOLECALCIFEROL, PO Take 200 Units by mouth daily. Pt. Reported     Facility-Administered Medications Prior to Visit  Medication Dose Route Frequency Provider Last Rate Last Dose  . 0.9 %  sodium chloride infusion  500 mL Intravenous Continuous Danis, Estill Cotta III, MD        No Known Allergies  Review of Systems  Constitutional: Negative for fever and malaise/fatigue.  HENT: Negative for congestion.   Eyes: Negative for blurred vision.  Respiratory: Negative for cough and shortness of breath.   Cardiovascular: Negative for chest pain, palpitations and leg swelling.  Gastrointestinal: Negative for vomiting.  Musculoskeletal: Negative for back pain.  Skin: Negative for rash.  Neurological: Negative for speech change, loss of consciousness and headaches.       Objective:    Physical Exam  Constitutional: She is oriented to person, place, and time. She appears well-developed and well-nourished. No distress.  HENT:  Head: Normocephalic and atraumatic.  Eyes: Conjunctivae are normal.  Neck: Neck supple. No thyromegaly present.  Cardiovascular: Normal rate, regular rhythm and normal heart sounds.  No murmur heard. Pulmonary/Chest: Effort normal and breath sounds normal. No respiratory distress.  Abdominal: Soft. Bowel sounds are normal. She exhibits no distension and no mass. There is no tenderness.  Musculoskeletal: She exhibits no edema.  Lymphadenopathy:    She has no cervical adenopathy.  Neurological: She is alert and oriented to person,  place, and time.  Skin: Skin is warm and dry.  Psychiatric: She has a normal mood and affect. Her behavior is normal.    There were no vitals taken for this visit. Wt Readings from Last 3 Encounters:  10/17/17 139 lb (63 kg)  01/03/17 138 lb (62.6 kg)  12/13/16 138 lb 9.6 oz (62.9 kg)   BP Readings from Last 3 Encounters:  10/17/17 120/66  01/03/17 (!) 104/59  12/13/16 124/74     Immunization History  Administered Date(s) Administered  . Influenza,  High Dose Seasonal PF 03/25/2015, 03/30/2016  . Pneumococcal Conjugate-13 02/26/2014  . Pneumococcal Polysaccharide-23 03/30/2016  . Tdap 02/26/2014  . Zoster 08/27/2011    Health Maintenance  Topic Date Due  . INFLUENZA VACCINE  01/30/2018  . MAMMOGRAM  10/31/2018  . COLONOSCOPY  01/03/2022  . TETANUS/TDAP  02/27/2024  . DEXA SCAN  Completed  . Hepatitis C Screening  Completed  . PNA vac Low Risk Adult  Completed    Lab Results  Component Value Date   WBC 7.1 10/12/2016   HGB 13.5 10/12/2016   HCT 39.8 10/12/2016   PLT 275.0 10/12/2016   GLUCOSE 99 10/17/2017   CHOL 213 (H) 10/17/2017   TRIG 108.0 10/17/2017   HDL 77.30 10/17/2017   LDLCALC 114 (H) 10/17/2017   ALT 16 10/17/2017   AST 20 10/17/2017   NA 137 10/17/2017   K 5.0 10/17/2017   CL 102 10/17/2017   CREATININE 1.11 10/17/2017   BUN 30 (H) 10/17/2017   CO2 28 10/17/2017   TSH 2.46 10/17/2017   HGBA1C 5.7 10/17/2017    Lab Results  Component Value Date   TSH 2.46 10/17/2017   Lab Results  Component Value Date   WBC 7.1 10/12/2016   HGB 13.5 10/12/2016   HCT 39.8 10/12/2016   MCV 89.5 10/12/2016   PLT 275.0 10/12/2016   Lab Results  Component Value Date   NA 137 10/17/2017   K 5.0 10/17/2017   CO2 28 10/17/2017   GLUCOSE 99 10/17/2017   BUN 30 (H) 10/17/2017   CREATININE 1.11 10/17/2017   BILITOT 0.4 10/17/2017   ALKPHOS 61 10/17/2017   AST 20 10/17/2017   ALT 16 10/17/2017   PROT 7.3 10/17/2017   ALBUMIN 4.2 10/17/2017   CALCIUM  9.4 10/17/2017   GFR 51.49 (L) 10/17/2017   Lab Results  Component Value Date   CHOL 213 (H) 10/17/2017   Lab Results  Component Value Date   HDL 77.30 10/17/2017   Lab Results  Component Value Date   LDLCALC 114 (H) 10/17/2017   Lab Results  Component Value Date   TRIG 108.0 10/17/2017   Lab Results  Component Value Date   CHOLHDL 3 10/17/2017   Lab Results  Component Value Date   HGBA1C 5.7 10/17/2017         Assessment & Plan:   Problem List Items Addressed This Visit    Hyperlipidemia    Encouraged heart healthy diet, increase exercise, avoid trans fats, consider a krill oil cap daily      Relevant Orders   Lipid panel (Completed)   TSH (Completed)   Vitamin D deficiency    Check level today. Vitamin D 2000 IU daily      Relevant Orders   TSH (Completed)   VITAMIN D 25 Hydroxy (Vit-D Deficiency, Fractures) (Completed)   Impaired fasting glucose     minimize simple carbs. Increase exercise as tolerated.       Relevant Orders   Hemoglobin A1c (Completed)   Comprehensive metabolic panel (Completed)   TSH (Completed)   Recurrent BCC (basal cell carcinoma)   Relevant Orders   Ambulatory referral to Dermatology   Preventative health care    Patient encouraged to maintain heart healthy diet, regular exercise, adequate sleep. Consider daily probiotics. Take medications as prescribed      Relevant Orders   TSH (Completed)    Other Visit Diagnoses    Sun-damaged skin    -  Primary   Relevant Orders  Ambulatory referral to Dermatology      I am having Joanne Chars maintain her Multiple Vitamins-Minerals (MULTIVITAMIN ADULT PO), (VITAMIN D, CHOLECALCIFEROL, PO), Biotin, Acetaminophen (TYLENOL EXTRA STRENGTH PO), and DIGESTIVE ADVANTAGE. We will continue to administer sodium chloride.  No orders of the defined types were placed in this encounter.   CMA served as Education administrator during this visit. History, Physical and Plan performed by medical provider.  Documentation and orders reviewed and attested to.  Penni Homans, MD

## 2017-10-17 NOTE — Patient Instructions (Addendum)
Rub hands with Lidocaine gel by [employer]percre[employer], salon pas and ICY Hot  Shingrix, new shingles shot, 2 shots over 2-6 months, can get at pharmacy  Stress balls each night to exercise hands Recommend calcium intake of 1200 to 1500 mg daily, divided into roughly 3 doses. Best source is the diet and a single dairy serving is about 500 mg, a supplement of calcium citrate once or twice daily to balance diet is fine if not getting enough in diet. Also need Vitamin D 2000 IU caps, 1 cap daily if not already taking vitamin D. Also recommend weight baring exercise on hips and upper body to keep bones strong  Preventive Care 65 Years and Older, Female Preventive care refers to lifestyle choices and visits with your health care provider that can promote health and wellness. What does preventive care include?  A yearly physical exam. This is also called an annual well check.  Dental exams once or twice a year.  Routine eye exams. Ask your health care provider how often you should have your eyes checked.  Personal lifestyle choices, including: ? Daily care of your teeth and gums. ? Regular physical activity. ? Eating a healthy diet. ? Avoiding tobacco and drug use. ? Limiting alcohol use. ? Practicing safe sex. ? Taking low-dose aspirin every day. ? Taking vitamin and mineral supplements as recommended by your health care provider. What happens during an annual well check? The services and screenings done by your health care provider during your annual well check will depend on your age, overall health, lifestyle risk factors, and family history of disease. Counseling Your health care provider may ask you questions about your:  Alcohol use.  Tobacco use.  Drug use.  Emotional well-being.  Home and relationship well-being.  Sexual activity.  Eating habits.  History of falls.  Memory and ability to understand (cognition).  Work and work Statistician.  Reproductive  health.  Screening You may have the following tests or measurements:  Height, weight, and BMI.  Blood pressure.  Lipid and cholesterol levels. These may be checked every 5 years, or more frequently if you are over 38 years old.  Skin check.  Lung cancer screening. You may have this screening every year starting at age 75 if you have a 30-pack-year history of smoking and currently smoke or have quit within the past 15 years.  Fecal occult blood test (FOBT) of the stool. You may have this test every year starting at age 17.  Flexible sigmoidoscopy or colonoscopy. You may have a sigmoidoscopy every 5 years or a colonoscopy every 10 years starting at age 58.  Hepatitis C blood test.  Hepatitis B blood test.  Sexually transmitted disease (STD) testing.  Diabetes screening. This is done by checking your blood sugar (glucose) after you have not eaten for a while (fasting). You may have this done every 1-3 years.  Bone density scan. This is done to screen for osteoporosis. You may have this done starting at age 39.  Mammogram. This may be done every 1-2 years. Talk to your health care provider about how often you should have regular mammograms.  Talk with your health care provider about your test results, treatment options, and if necessary, the need for more tests. Vaccines Your health care provider may recommend certain vaccines, such as:  Influenza vaccine. This is recommended every year.  Tetanus, diphtheria, and acellular pertussis (Tdap, Td) vaccine. You may need a Td booster every 10 years.  Varicella vaccine. You may need  this if you have not been vaccinated.  Zoster vaccine. You may need this after age 6.  Measles, mumps, and rubella (MMR) vaccine. You may need at least one dose of MMR if you were born in 1957 or later. You may also need a second dose.  Pneumococcal 13-valent conjugate (PCV13) vaccine. One dose is recommended after age 50.  Pneumococcal polysaccharide  (PPSV23) vaccine. One dose is recommended after age 15.  Meningococcal vaccine. You may need this if you have certain conditions.  Hepatitis A vaccine. You may need this if you have certain conditions or if you travel or work in places where you may be exposed to hepatitis A.  Hepatitis B vaccine. You may need this if you have certain conditions or if you travel or work in places where you may be exposed to hepatitis B.  Haemophilus influenzae type b (Hib) vaccine. You may need this if you have certain conditions.  Talk to your health care provider about which screenings and vaccines you need and how often you need them. This information is not intended to replace advice given to you by your health care provider. Make sure you discuss any questions you have with your health care provider. Document Released: 07/15/2015 Document Revised: 03/07/2016 Document Reviewed: 04/19/2015 Elsevier Interactive Patient Education  [employer].

## 2017-10-17 NOTE — Progress Notes (Signed)
Subjective:   Catherine Prince is a 72 y.o. female who presents for Medicare Annual (Subsequent) preventive examination.  Review of Systems: No ROS.  Medicare Wellness Visit. Additional risk factors are reflected in the social history.  Cardiac Risk Factors include: advanced age (>19men, >9 women);dyslipidemia Sleep patterns: Really good sleep per pt. Home Safety/Smoke Alarms: Feels safe in home. Smoke alarms in place.  Living environment; residence and Firearm Safety: Lives with husband in 2 story home.   Female:       Mammo- utd      Dexa scan-utd        CCS-utd    Objective:     Vitals: BP 120/66 (BP Location: Left Arm, Patient Position: Sitting, Cuff Size: Normal)   Pulse 73   Ht 5\' 2"  (1.575 m)   Wt 139 lb (63 kg)   SpO2 98%   BMI 25.42 kg/m   Body mass index is 25.42 kg/m.  Advanced Directives 10/17/2017 10/12/2016 03/25/2015  Does Patient Have a Medical Advance Directive? Yes Yes Yes  Type of Paramedic of [place];Living will [place];Living will [place];Living will  Does patient want to make changes to medical advance directive? No - Patient declined Yes (Inpatient - patient requests chaplain consult to change a medical advance directive) No - Patient declined  Copy of [place] in Chart? No - copy requested No - copy requested No - copy requested    Tobacco Social History   Tobacco Use  Smoking Status Never Smoker  Smokeless Tobacco Never Used     Counseling given: Not Answered   Clinical Intake:  Pain : No/denies pain     Past Medical History:  Diagnosis Date  . Allergy    mild seasonal allergies  . Arthritis   . Cataract    Left eye  . Diverticulitis   . Diverticulosis of colon without hemorrhage 12/09/2014  . H/O diverticulitis of colon 12/09/2014   7.5 inches removed after rupture in 2010, has done well since Also rook appendix and 1 ovary  . History of chicken  pox   . Hyperlipidemia   . Impaired fasting glucose 12/09/2014  . Macular hole of left eye   . Measles   . Mumps   . Nocturia 12/09/2014  . Osteoarthritis 12/09/2014  . Osteopenia 04/01/2015  . Personal history of cardiac arrhythmia 03/30/2016  . Vitamin D deficiency    Past Surgical History:  Procedure Laterality Date  . APPENDECTOMY    . BLEPHAROPLASTY Bilateral 08/02/2017  . BREAST CYST ASPIRATION    . CATARACT EXTRACTION Left   . COLON SURGERY  2010   pt. reports intestines burst  . EYE SURGERY     Left eye, retinal hole, Dr Manson Allan, at [facility] in [place] Left    09/2013  . REPLACEMENT TOTAL KNEE Right 2016  . RIGHT OOPHORECTOMY Right    during Diverticular surgery   Family History  Problem Relation Age of Onset  . Heart disease Father 60  . Thyroid disease Mother   . Diabetes Mother        developed in her 47's  . Dementia Mother        mini strokes  . Heart disease Maternal Grandmother   . Prostate cancer Maternal Grandfather   . Heart disease Paternal Grandmother   . Heart disease Paternal Grandfather   . Colon cancer Neg Hx   . Esophageal cancer Neg Hx   .  Stomach cancer Neg Hx   . Pancreatic cancer Neg Hx    Social History   Socioeconomic History  . Marital status: Married    Spouse name: Not on file  . Number of children: Not on file  . Years of education: 42  . Highest education level: Not on file  Occupational History  . Occupation: Web designer at [employer]  . Financial resource strain: Not on file  . Food insecurity:    Worry: Not on file    Inability: Not on file  . Transportation needs:    Medical: Not on file    Non-medical: Not on file  Tobacco Use  . Smoking status: Never Smoker  . Smokeless tobacco: Never Used  Substance and Sexual Activity  . Alcohol use: Yes    Alcohol/week: 0.0 oz    Comment: occassional of wine  . Drug use: No  . Sexual activity: Yes    Comment: lives with  husband, works with her church, no dietary restrictions  Lifestyle  . Physical activity:    Days per week: Not on file    Minutes per session: Not on file  . Stress: Not on file  Relationships  . Social connections:    Talks on phone: Not on file    Gets together: Not on file    Attends religious service: Not on file    Active member of club or organization: Not on file    Attends meetings of clubs or organizations: Not on file    Relationship status: Not on file  Other Topics Concern  . Not on file  Social History Narrative  . Not on file    Outpatient Encounter Medications as of 10/17/2017  Medication Sig  . Acetaminophen (TYLENOL EXTRA STRENGTH PO) Take by mouth as needed.  . Biotin 1000 MCG tablet Take 1,000 mcg by mouth daily.  . Multiple Vitamins-Minerals (MULTIVITAMIN ADULT PO) Take 1 tablet by mouth daily.  . Probiotic Product (DIGESTIVE ADVANTAGE) CAPS 1 capsule daily.   Marland Kitchen VITAMIN D, CHOLECALCIFEROL, PO Take 200 Units by mouth daily. Pt. Reported   Facility-Administered Encounter Medications as of 10/17/2017  Medication  . 0.9 %  sodium chloride infusion    Activities of Daily Living In your present state of health, do you have any difficulty performing the following activities: 10/17/2017  Hearing? N  Vision? N  Comment wears contact for reading in right eye  Difficulty concentrating or making decisions? N  Walking or climbing stairs? N  Dressing or bathing? N  Doing errands, shopping? N  Preparing Food and eating ? N  Using the Toilet? N  Do you have problems with loss of bowel control? N  Managing your Medications? N  Managing your Finances? N  Housekeeping or managing your Housekeeping? N  Some recent data might be hidden    Patient Care Team: [name], MD as PCP - General (Family Medicine) Nicole Kindred, MD as Referring Physician (Orthopedic Surgery)    Assessment:   This is a routine wellness examination for [place]. Physical assessment  deferred to PCP.   Exercise Activities and Dietary recommendations Current Exercise Habits: Home exercise routine, Type of exercise: strength training/weights;yoga;stretching, Time (Minutes): 30, Frequency (Times/Week): 5, Weekly Exercise (Minutes/Week): 150, Intensity: Mild   Diet (meal preparation, eat out, water intake, caffeinated beverages, dairy products, fruits and vegetables): in general, a "healthy" diet  , well balanced      Goals    . Increase physical activity     *  Exercise program at home     . Lose 10-15 lbs by next year.   (pt-stated)       Fall Risk Fall Risk  10/17/2017 10/12/2016 03/30/2016 03/25/2015  Falls in the past year? No Yes Yes No  Number falls in past yr: - 1 1 -  Comment - - fell on a path walking her dog -  Injury with Fall? - No No -  Follow up - Education provided;Falls prevention discussed - -     Depression Screen PHQ 2/9 Scores 10/17/2017 10/12/2016 03/30/2016 03/25/2015  PHQ - 2 Score 0 0 0 0     Cognitive Function Ad8 score reviewed for issues:  Issues making decisions:no  Less interest in hobbies / activities:no  Repeats questions, stories (family complaining):no  Trouble using ordinary gadgets (microwave, computer, phone):no  Forgets the month or year: no  Mismanaging finances: no  Remembering appts:no  Daily problems with thinking and/or memory:no Ad8 score is=0  MMSE - Mini Mental State Exam 03/25/2015  Orientation to time 5  Orientation to Place 5  Registration 3  Attention/ Calculation 5  Recall 3  Language- name 2 objects 2  Language- repeat 1  Language- follow 3 step command 3  Language- read & follow direction 1  Write a sentence 1  Copy design 1  Total score 30        Immunization History  Administered Date(s) Administered  . Influenza, High Dose Seasonal PF 03/25/2015, 03/30/2016  . Pneumococcal Conjugate-13 02/26/2014  . Pneumococcal Polysaccharide-23 03/30/2016  . Tdap 02/26/2014  . Zoster 08/27/2011     Screening Tests Health Maintenance  Topic Date Due  . INFLUENZA VACCINE  01/30/2018  . MAMMOGRAM  10/31/2018  . COLONOSCOPY  01/03/2022  . TETANUS/TDAP  02/27/2024  . DEXA SCAN  Completed  . Hepatitis C Screening  Completed  . PNA vac Low Risk Adult  Completed      Plan:   Follow up with Dr.Blyth today as scheduled  Continue to eat heart healthy diet (full of fruits, vegetables, whole grains, lean protein, water--limit salt, fat, and sugar intake) and increase physical activity as tolerated.  Continue doing brain stimulating activities (puzzles, reading, adult coloring books, staying active) to keep memory sharp.   Bring a copy of your living will and/or healthcare power of attorney to your next office visit.    I have personally reviewed and noted the following in the patient's chart:   . Medical and social history . Use of alcohol, tobacco or illicit drugs  . Current medications and supplements . Functional ability and status . Nutritional status . Physical activity . Advanced directives . List of other physicians . Hospitalizations, surgeries, and ER visits in previous 12 months . Vitals . Screenings to include cognitive, depression, and falls . Referrals and appointments  In addition, I have reviewed and discussed with patient certain preventive protocols, quality metrics, and best practice recommendations. A written personalized care plan for preventive services as well as general preventive health recommendations were provided to patient.     Naaman Plummer [place], [place]  10/17/2017

## 2017-10-17 NOTE — Assessment & Plan Note (Signed)
minimize simple carbs. Increase exercise as tolerated.  

## 2017-10-17 NOTE — Progress Notes (Signed)
Medical screening examination/treatment was performed by qualified clinical staff member and as supervising physician I was immediately available for consultation/collaboration. I have reviewed documentation and agree with assessment and plan.  Tory Septer, MD 

## 2017-10-17 NOTE — Assessment & Plan Note (Signed)
Check level today. Vitamin D 2000 IU daily

## 2017-10-17 NOTE — Assessment & Plan Note (Signed)
Patient encouraged to maintain heart healthy diet, regular exercise, adequate sleep. Consider daily probiotics. Take medications as prescribed 

## 2017-10-17 NOTE — Assessment & Plan Note (Signed)
Encouraged heart healthy diet, increase exercise, avoid trans fats, consider a krill oil cap daily 

## 2017-11-19 ENCOUNTER — Other Ambulatory Visit: Payer: Self-pay | Admitting: Family Medicine

## 2017-11-19 DIAGNOSIS — Z1231 Encounter for screening mammogram for malignant neoplasm of breast: Secondary | ICD-10-CM

## 2017-11-21 ENCOUNTER — Ambulatory Visit (HOSPITAL_BASED_OUTPATIENT_CLINIC_OR_DEPARTMENT_OTHER)
Admission: RE | Admit: 2017-11-21 | Discharge: 2017-11-21 | Disposition: A | Discharge status: Home / Self Care | Payer: Medicare HMO | Source: Ambulatory Visit | Attending: Family Medicine | Admitting: Family Medicine

## 2017-11-21 DIAGNOSIS — Z1231 Encounter for screening mammogram for malignant neoplasm of breast: Secondary | ICD-10-CM | POA: Insufficient documentation

## 2018-01-01 DIAGNOSIS — H52223 Regular astigmatism, bilateral: Secondary | ICD-10-CM | POA: Diagnosis not present

## 2018-01-04 ENCOUNTER — Other Ambulatory Visit: Payer: Self-pay

## 2018-01-04 ENCOUNTER — Inpatient Hospital Stay (HOSPITAL_BASED_OUTPATIENT_CLINIC_OR_DEPARTMENT_OTHER): Payer: Medicare HMO

## 2018-01-04 ENCOUNTER — Encounter (HOSPITAL_BASED_OUTPATIENT_CLINIC_OR_DEPARTMENT_OTHER): Payer: Self-pay | Admitting: Emergency Medicine

## 2018-01-04 ENCOUNTER — Emergency Department (HOSPITAL_BASED_OUTPATIENT_CLINIC_OR_DEPARTMENT_OTHER): Payer: Medicare HMO

## 2018-01-04 ENCOUNTER — Inpatient Hospital Stay (HOSPITAL_BASED_OUTPATIENT_CLINIC_OR_DEPARTMENT_OTHER)
Admission: EM | Admit: 2018-01-04 | Discharge: 2018-01-06 | DRG: 390 | Disposition: A | Discharge status: Home / Self Care | Payer: Medicare HMO | Attending: Internal Medicine | Admitting: Internal Medicine

## 2018-01-04 DIAGNOSIS — Z9049 Acquired absence of other specified parts of digestive tract: Secondary | ICD-10-CM

## 2018-01-04 DIAGNOSIS — K56609 Unspecified intestinal obstruction, unspecified as to partial versus complete obstruction: Secondary | ICD-10-CM | POA: Diagnosis not present

## 2018-01-04 DIAGNOSIS — Z96653 Presence of artificial knee joint, bilateral: Secondary | ICD-10-CM | POA: Diagnosis present

## 2018-01-04 DIAGNOSIS — Z90721 Acquired absence of ovaries, unilateral: Secondary | ICD-10-CM

## 2018-01-04 DIAGNOSIS — Z4682 Encounter for fitting and adjustment of non-vascular catheter: Secondary | ICD-10-CM | POA: Diagnosis not present

## 2018-01-04 DIAGNOSIS — K56600 Partial intestinal obstruction, unspecified as to cause: Principal | ICD-10-CM | POA: Diagnosis present

## 2018-01-04 DIAGNOSIS — K5669 Other partial intestinal obstruction: Secondary | ICD-10-CM | POA: Diagnosis not present

## 2018-01-04 DIAGNOSIS — R109 Unspecified abdominal pain: Secondary | ICD-10-CM | POA: Diagnosis not present

## 2018-01-04 DIAGNOSIS — Z0189 Encounter for other specified special examinations: Secondary | ICD-10-CM

## 2018-01-04 DIAGNOSIS — R1013 Epigastric pain: Secondary | ICD-10-CM | POA: Diagnosis not present

## 2018-01-04 HISTORY — DX: Unspecified intestinal obstruction, unspecified as to partial versus complete obstruction: K56.609

## 2018-01-04 HISTORY — DX: Malignant (primary) neoplasm, unspecified: C80.1

## 2018-01-04 LAB — COMPREHENSIVE METABOLIC PANEL
ALK PHOS: 70 U/L (ref 38–126)
ALT: 20 U/L (ref 0–44)
AST: 31 U/L (ref 15–41)
Albumin: 4.2 g/dL (ref 3.5–5.0)
Anion gap: 13 (ref 5–15)
BILIRUBIN TOTAL: 0.7 mg/dL (ref 0.3–1.2)
BUN: 36 mg/dL — ABNORMAL HIGH (ref 8–23)
CALCIUM: 9.3 mg/dL (ref 8.9–10.3)
CHLORIDE: 101 mmol/L (ref 98–111)
CO2: 24 mmol/L (ref 22–32)
CREATININE: 1.23 mg/dL — AB (ref 0.44–1.00)
GFR, EST AFRICAN AMERICAN: 50 mL/min — AB (ref >60)
GFR, EST NON AFRICAN AMERICAN: 43 mL/min — AB (ref >60)
Glucose, Bld: 174 mg/dL — ABNORMAL HIGH (ref 70–99)
Potassium: 4 mmol/L (ref 3.5–5.1)
Sodium: 138 mmol/L (ref 135–145)
TOTAL PROTEIN: 8.1 g/dL (ref 6.5–8.1)

## 2018-01-04 LAB — CBC WITH DIFFERENTIAL/PLATELET
Basophils Absolute: 0 10*3/uL (ref 0.0–0.1)
Basophils Relative: 0 %
EOS PCT: 0 %
Eosinophils Absolute: 0 10*3/uL (ref 0.0–0.7)
HEMATOCRIT: 39.8 % (ref 36.0–46.0)
Hemoglobin: 13.8 g/dL (ref 12.0–15.0)
LYMPHS PCT: 8 %
Lymphs Abs: 0.9 10*3/uL (ref 0.7–4.0)
MCH: 30.6 pg (ref 26.0–34.0)
MCHC: 34.7 g/dL (ref 30.0–36.0)
MCV: 88.2 fL (ref 78.0–100.0)
Monocytes Absolute: 0.5 10*3/uL (ref 0.1–1.0)
Monocytes Relative: 5 %
NEUTROS ABS: 9 10*3/uL — AB (ref 1.7–7.7)
Neutrophils Relative %: 87 %
PLATELETS: 308 10*3/uL (ref 150–400)
RBC: 4.51 MIL/uL (ref 3.87–5.11)
RDW: 13 % (ref 11.5–15.5)
WBC: 10.4 10*3/uL (ref 4.0–10.5)

## 2018-01-04 LAB — URINALYSIS, ROUTINE W REFLEX MICROSCOPIC
Bilirubin Urine: NEGATIVE
GLUCOSE, UA: NEGATIVE mg/dL
KETONES UR: 15 mg/dL — AB
Leukocytes, UA: NEGATIVE
Nitrite: NEGATIVE
PROTEIN: NEGATIVE mg/dL
Specific Gravity, Urine: <1.005 — ABNORMAL LOW (ref 1.005–1.030)
pH: 6.5 (ref 5.0–8.0)

## 2018-01-04 LAB — URINALYSIS, MICROSCOPIC (REFLEX): WBC, UA: NONE SEEN WBC/hpf (ref 0–5)

## 2018-01-04 LAB — LIPASE, BLOOD: LIPASE: 39 U/L (ref 11–51)

## 2018-01-04 MED ORDER — ONDANSETRON HCL 4 MG/2ML IJ SOLN
4.0000 mg | Freq: Once | INTRAMUSCULAR | Status: AC
  Administered 2018-01-04: 4 mg via INTRAVENOUS
  Filled 2018-01-04: qty 2

## 2018-01-04 MED ORDER — ONDANSETRON HCL 4 MG PO TABS: 4.0000 mg | ORAL_TABLET | Freq: Four times a day (QID) | ORAL | Status: DC | PRN

## 2018-01-04 MED ORDER — SODIUM CHLORIDE 0.9 % IV SOLN
INTRAVENOUS | Status: DC
  Administered 2018-01-04 – 2018-01-05 (×3): via INTRAVENOUS
  Administered 2018-01-05: 1000 mL via INTRAVENOUS

## 2018-01-04 MED ORDER — SODIUM CHLORIDE 0.9 % IV BOLUS
1000.0000 mL | Freq: Once | INTRAVENOUS | Status: AC
  Administered 2018-01-04: 1000 mL via INTRAVENOUS

## 2018-01-04 MED ORDER — ACETAMINOPHEN 650 MG RE SUPP: 650.0000 mg | Freq: Four times a day (QID) | RECTAL | Status: DC | PRN

## 2018-01-04 MED ORDER — HYDROMORPHONE HCL 1 MG/ML IJ SOLN
0.5000 mg | Freq: Once | INTRAMUSCULAR | Status: AC
  Administered 2018-01-04: 0.5 mg via INTRAVENOUS
  Filled 2018-01-04: qty 1

## 2018-01-04 MED ORDER — ONDANSETRON HCL 4 MG/2ML IJ SOLN
4.0000 mg | Freq: Four times a day (QID) | INTRAMUSCULAR | Status: DC | PRN
Start: 2018-01-04 — End: 2018-01-06
  Administered 2018-01-04 – 2018-01-05 (×2): 4 mg via INTRAVENOUS
  Filled 2018-01-04 (×2): qty 2

## 2018-01-04 MED ORDER — SODIUM CHLORIDE 0.9 % IV SOLN
INTRAVENOUS | Status: DC
  Administered 2018-01-04: 11:00:00 via INTRAVENOUS

## 2018-01-04 MED ORDER — ACETAMINOPHEN 325 MG PO TABS
650.0000 mg | ORAL_TABLET | Freq: Four times a day (QID) | ORAL | Status: DC | PRN
  Filled 2018-01-04: qty 2

## 2018-01-04 MED ORDER — MORPHINE SULFATE (PF) 2 MG/ML IV SOLN
2.0000 mg | INTRAVENOUS | Status: DC | PRN
  Administered 2018-01-04: 1 mg via INTRAVENOUS
  Filled 2018-01-04 (×3): qty 1

## 2018-01-04 MED ORDER — IOPAMIDOL (ISOVUE-300) INJECTION 61%
100.0000 mL | Freq: Once | INTRAVENOUS | Status: AC | PRN
  Administered 2018-01-04: 100 mL via INTRAVENOUS

## 2018-01-04 NOTE — ED Notes (Signed)
Report given to accepting nurse at [facility]

## 2018-01-04 NOTE — ED Notes (Signed)
ED Provider at bedside. 

## 2018-01-04 NOTE — Consult Note (Signed)
Chief Complaint:  Nausea vomiting and SBO by CT scan at [facility]  History of Present Illness:  Catherine Prince is an 72 y.o. female who presented with nausea and vomiting beginning last night with cramping abdominal pain.  CT showed dilated small bowel without obvious transition point.  Transferred to [facility].    Presently with NG in place with little output.  Patient ate peanuts yesterday and a large garden salad last night.  She has had prior colon resection and appendectomy for diverticulitis.  Presently comfortable with pain meds on board.    Past Medical History:  Diagnosis Date  . Allergy    mild seasonal allergies  . Arthritis   . Cancer ([place])   . Cataract    Left eye  . Diverticulitis   . Diverticulosis of colon without hemorrhage 12/09/2014  . H/O diverticulitis of colon 12/09/2014   7.5 inches removed after rupture in 2010, has done well since Also rook appendix and 1 ovary  . History of chicken pox   . Hyperlipidemia   . Impaired fasting glucose 12/09/2014  . Macular hole of left eye   . Measles   . Mumps   . Nocturia 12/09/2014  . Osteoarthritis 12/09/2014  . Osteopenia 04/01/2015  . Personal history of cardiac arrhythmia 03/30/2016  . Vitamin D deficiency     Past Surgical History:  Procedure Laterality Date  . APPENDECTOMY    . BLEPHAROPLASTY Bilateral 08/02/2017  . BREAST CYST ASPIRATION    . CATARACT EXTRACTION Left   . COLON SURGERY  2010   pt. reports intestines burst  . EYE SURGERY     Left eye, retinal hole, Dr Manson Allan, at [facility] in [place] Left    09/2013  . REPLACEMENT TOTAL KNEE Right 2016  . RIGHT OOPHORECTOMY Right    during Diverticular surgery    Current Facility-Administered Medications  Medication Dose Route Frequency Provider Last Rate Last Dose  . 0.9 %  sodium chloride infusion   Intravenous Continuous Starla Link, Kshitiz, MD 100 mL/hr at 01/04/18 1459    . acetaminophen (TYLENOL) tablet 650 mg  650 mg Oral Q6H  PRN Aline August, MD       Or  . acetaminophen (TYLENOL) suppository 650 mg  650 mg Rectal Q6H PRN Alekh, Kshitiz, MD      . morphine 2 MG/ML injection 2 mg  2 mg Intravenous Q2H PRN Alekh, Kshitiz, MD      . ondansetron (ZOFRAN) tablet 4 mg  4 mg Oral Q6H PRN Alekh, Kshitiz, MD       Or  . ondansetron (ZOFRAN) injection 4 mg  4 mg Intravenous Q6H PRN Aline August, MD       Patient has no known allergies. Family History  Problem Relation Age of Onset  . Heart disease Father 83  . Thyroid disease Mother   . Diabetes Mother        developed in her 38's  . Dementia Mother        mini strokes  . Heart disease Maternal Grandmother   . Prostate cancer Maternal Grandfather   . Heart disease Paternal Grandmother   . Heart disease Paternal Grandfather   . Colon cancer Neg Hx   . Esophageal cancer Neg Hx   . Stomach cancer Neg Hx   . Pancreatic cancer Neg Hx    Social History:   reports that she has never smoked. She has never used smokeless tobacco. She reports that she  drinks alcohol. She reports that she does not use drugs.   REVIEW OF SYSTEMS : Negative except for eating high fiber diet yesterday.  Physical Exam:   Blood pressure 138/77, pulse 69, temperature 97.6 F (36.4 C), temperature source Oral, resp. rate 14, height _0  (1.575 m), weight 61.2 kg (135 lb), SpO2 100 %. Body mass index is 24.69 kg/m.  Gen:  WDWN WF NAD  Neurological: Alert and oriented to person, place, and time. Motor and sensory function is grossly intact  Head: Normocephalic and atraumatic.  Eyes: Conjunctivae are normal. Pupils are equal, round, and reactive to light. No scleral icterus.  Neck: Normal range of motion. Neck supple. No tracheal deviation or thyromegaly present.  Cardiovascular:  SR without murmurs or gallops.  No carotid bruits Breast:  Not examined Respiratory: Effort normal.  No respiratory distress. No chest wall tenderness. Breath sounds normal.  No wheezes, rales or rhonchi.   Abdomen:  Mildly distended without rebound or guarding and palpable without pain.  No flatus GU:  Not examined Musculoskeletal: Normal range of motion. Extremities are nontender. No cyanosis, edema or clubbing noted Lymphadenopathy: No cervical, preauricular, postauricular or axillary adenopathy is present Skin: Skin is warm and dry. No rash noted. No diaphoresis. No erythema. No pallor. Pscyh: Normal mood and affect. Behavior is normal. Judgment and thought content normal.   LABORATORY RESULTS: Results for orders placed or performed during the hospital encounter of 01/04/18 (from the past 48 hour(s))  CBC with Differential     Status: Abnormal   Collection Time: 01/04/18  8:20 AM  Result Value Ref Range   WBC 10.4 4.0 - 10.5 K/uL   RBC 4.51 3.87 - 5.11 MIL/uL   Hemoglobin 13.8 12.0 - 15.0 g/dL   HCT 39.8 36.0 - 46.0 %   MCV 88.2 78.0 - 100.0 fL   MCH 30.6 26.0 - 34.0 pg   MCHC 34.7 30.0 - 36.0 g/dL   RDW 13.0 11.5 - 15.5 %   Platelets 308 150 - 400 K/uL   Neutrophils Relative % 87 %   Neutro Abs 9.0 (H) 1.7 - 7.7 K/uL   Lymphocytes Relative 8 %   Lymphs Abs 0.9 0.7 - 4.0 K/uL   Monocytes Relative 5 %   Monocytes Absolute 0.5 0.1 - 1.0 K/uL   Eosinophils Relative 0 %   Eosinophils Absolute 0.0 0.0 - 0.7 K/uL   Basophils Relative 0 %   Basophils Absolute 0.0 0.0 - 0.1 K/uL    Comment: Performed at [facility], [address] [place], [place] [address]  Comprehensive metabolic panel     Status: Abnormal   Collection Time: 01/04/18  8:20 AM  Result Value Ref Range   Sodium 138 135 - 145 mmol/L   Potassium 4.0 3.5 - 5.1 mmol/L   Chloride 101 98 - 111 mmol/L    Comment: Please note change in reference range.   CO2 24 22 - 32 mmol/L   Glucose, Bld 174 (H) 70 - 99 mg/dL    Comment: Please note change in reference range.   BUN 36 (H) 8 - 23 mg/dL    Comment: Please note change in reference range.   Creatinine, Ser 1.23 (H) 0.44 - 1.00 mg/dL   Calcium 9.3 8.9 - 10.3  mg/dL   Total Protein 8.1 6.5 - 8.1 g/dL   Albumin 4.2 3.5 - 5.0 g/dL   AST 31 15 - 41 U/L   ALT 20 0 - 44 U/L  Comment: Please note change in reference range.   Alkaline Phosphatase 70 38 - 126 U/L   Total Bilirubin 0.7 0.3 - 1.2 mg/dL   GFR calc non Af Amer 43 (L) >60 mL/min   GFR calc Af Amer 50 (L) >60 mL/min    Comment: (NOTE) The eGFR has been calculated using the CKD EPI equation. This calculation has not been validated in all clinical situations. eGFR's persistently <60 mL/min signify possible Chronic Kidney Disease.    Anion gap 13 5 - 15    Comment: Performed at [facility], [address] [place], [place] [address]  Lipase, blood     Status: None   Collection Time: 01/04/18  8:20 AM  Result Value Ref Range   Lipase 39 11 - 51 U/L    Comment: Performed at [facility], [address] [place], [place] [address]  Urinalysis, Routine w reflex microscopic     Status: Abnormal   Collection Time: 01/04/18 10:37 AM  Result Value Ref Range   Color, Urine STRAW (A) YELLOW   APPearance CLEAR CLEAR   Specific Gravity, Urine <1.005 (L) 1.005 - 1.030   pH 6.5 5.0 - 8.0   Glucose, UA NEGATIVE NEGATIVE mg/dL   Hgb urine dipstick TRACE (A) NEGATIVE   Bilirubin Urine NEGATIVE NEGATIVE   Ketones, ur 15 (A) NEGATIVE mg/dL   Protein, ur NEGATIVE NEGATIVE mg/dL   Nitrite NEGATIVE NEGATIVE   Leukocytes, UA NEGATIVE NEGATIVE    Comment: Performed at [facility], [address] [place], [place] [address]  Urinalysis, Microscopic (reflex)     Status: Abnormal   Collection Time: 01/04/18 10:37 AM  Result Value Ref Range   RBC / HPF 0-5 0 - 5 RBC/hpf   WBC, UA NONE SEEN 0 - 5 WBC/hpf   Bacteria, UA FEW (A) NONE SEEN   Squamous Epithelial / LPF 0-5 0 - 5    Comment: Performed at [facility], [address] [place], [place] [address]     RADIOLOGY RESULTS: Ct Abdomen Pelvis W Contrast  Result Date: 01/04/2018 CLINICAL DATA:   Abdominal pain.  Concern for diverticulitis. EXAM: CT ABDOMEN AND PELVIS WITH CONTRAST TECHNIQUE: Multidetector CT imaging of the abdomen and pelvis was performed using the standard protocol following bolus administration of intravenous contrast. CONTRAST:  149m ISOVUE-300 IOPAMIDOL (ISOVUE-300) INJECTION 61% COMPARISON:  None. FINDINGS: Lower chest: Limited visualization of the lower thorax demonstrates minimal dependent subpleural ground-glass atelectasis. No discrete focal airspace opacities. Normal heart size.  No pericardial effusion. Hepatobiliary: Normal hepatic contour. No discrete hepatic lesions. Normal appearance of the gallbladder. No radiopaque gallstones. No intra extrahepatic biliary duct dilatation. No ascites. Pancreas: Normal appearance of the pancreas Spleen: Normal appearance of the spleen Adrenals/Urinary Tract: There is symmetric enhancement and excretion of the bilateral kidneys. Bilateral subcentimeter hypoattenuating renal lesions are too small to accurately characterize though favored to represent renal cysts. No definite renal stones this postcontrast examination. No urinary obstruction or perinephric stranding. Normal appearance the bilateral adrenal glands. Normal appearance of the urinary bladder given underdistention. Stomach/Bowel: There is moderate distention of several loops of small bowel within the lower abdomen with transition point located within the left mid hemi pelvis (image 65, series 2), with associated decompression of the downstream distal loops of small bowel, constellation of findings worrisome for small bowel obstruction. Normal appearance of the terminal ileum. The appendix is not visualized compatible with provided operative history. Large colonic  stool burden. Scattered minimal colonic diverticulosis without definitive evidence of acute diverticulitis. Postsurgical change of the sigmoid colon (image 61, series 2). Vascular/Lymphatic: Scattered atherosclerotic  plaque within a normal caliber abdominal aorta. The major branch vessels of the abdominal aorta appear widely patent. No bulky retroperitoneal, mesenteric, pelvic or inguinal lymphadenopathy. Reproductive: Appear normal for age. No discrete adnexal lesion. There is a small amount of fluid within the pelvic cul-de-sac, presumably reactive. Other: Regional soft tissues appear normal. Musculoskeletal: No acute or aggressive osseous abnormalities. Bilateral facet degenerative change of the lower lumbar spine. IMPRESSION: 1. Findings worrisome for small bowel obstruction with transition point located within the left hemipelvis - the etiology of this transition point is not identified and thus presumably secondary to adhesions. No evidence of perforation or definable/drainable fluid collection. 2. Post appendectomy and the sequela of prior sigmoid colonic resection with primary anastomosis. 3. Scattered colonic diverticulosis without evidence of diverticulitis. 4.  Aortic Atherosclerosis (ICD10-I70.0). Electronically Signed   By: Sandi Mariscal M.D.   On: 01/04/2018 10:10   Dg Chest Portable 1 View  Result Date: 01/04/2018 CLINICAL DATA:  72 year old female with a history of nasogastric tube placement EXAM: PORTABLE CHEST 1 VIEW COMPARISON:  None. FINDINGS: Cardiomediastinal silhouette within normal limits. Coarsened interstitial markings with low lung volumes. No pneumothorax or pleural effusion. No confluent airspace disease. No acute displaced fracture. Gastric tube terminates in the upper abdomen. IMPRESSION: Nasogastric tube terminates in the left upper quadrant. Low lung volumes with appearance of atelectasis/chronic lung changes. Electronically Signed   By: Corrie Mckusick D.O.   On: 01/04/2018 12:49    Problem List: Patient Active Problem List   Diagnosis Date Noted  . SBO (small bowel obstruction) ([place]) 01/04/2018  . Preventative health care 10/12/2016  . PVC's (premature ventricular contractions)  04/20/2016  . Recurrent BCC (basal cell carcinoma) 03/31/2016  . Personal history of cardiac arrhythmia 03/30/2016  . Osteopenia 04/01/2015  . Osteoarthritis 12/09/2014  . Nocturia 12/09/2014  . Diverticulosis of colon without hemorrhage 12/09/2014  . H/O diverticulitis of colon 12/09/2014  . Impaired fasting glucose 12/09/2014  . History of chicken pox   . Allergy   . Hyperlipidemia   . Vitamin D deficiency   . Macular hole of left eye     Assessment & Plan: Partial SBO hopefully a food bezoar that would break up and resolve spontaneously.  Will follow with you.      Matt B. Hassell Done, MD, [facility] [phone] beeper [phone]  01/04/2018 4:51 PM

## 2018-01-04 NOTE — ED Notes (Signed)
Pt unable to void at this time. 

## 2018-01-04 NOTE — H&P (Signed)
History and Physical    Catherine Prince TKZ:[id number] DOB: [birth year] DOA: 01/04/2018  PCP: [name], MD   Patient coming from: Home  I have personally briefly reviewed patient's old medical records in [place]  Chief Complaint: Abdominal pain and vomiting  HPI: Catherine Prince is a 72 y.o. female with medical history significant of appendectomy, partial colon resection due to comp occasions of diverticulitis in the past presented with acute onset of abdominal pain and persistent nausea and vomiting that started around 8 PM last evening.  Patient complained of sharp epigastric pain to begin with which became generalized pretty soon.  Her pain was almost constant, 7-8 out of 10 in intensity, progressively worsening, no radiation and no relieving or aggravating factors.  Patient had persistent vomiting without any blood in it.  Her last bowel movement was yesterday evening.  No recent fevers, travel, recent new food intake, dysuria, increased frequency of urination.  No chest pain, shortness of breath or palpitations.  ED Course: She presented to [place] where CT scan showed small bowel obstruction.  General surgery was consulted who recommended admission under hospitalist service.  Hospitalist service was called to evaluate the patient and patient was transferred to [facility] long hospital.  NG tube was placed.  Review of Systems: As per HPI otherwise 10 point review of systems negative.   Past Medical History:  Diagnosis Date  . Allergy    mild seasonal allergies  . Arthritis   . Cancer ([place])   . Cataract    Left eye  . Diverticulitis   . Diverticulosis of colon without hemorrhage 12/09/2014  . H/O diverticulitis of colon 12/09/2014   7.5 inches removed after rupture in 2010, has done well since Also rook appendix and 1 ovary  . History of chicken pox   . Hyperlipidemia   . Impaired fasting glucose 12/09/2014  . Macular hole of left eye   . Measles   . Mumps   .  Nocturia 12/09/2014  . Osteoarthritis 12/09/2014  . Osteopenia 04/01/2015  . Personal history of cardiac arrhythmia 03/30/2016  . Vitamin D deficiency     Past Surgical History:  Procedure Laterality Date  . APPENDECTOMY    . BLEPHAROPLASTY Bilateral 08/02/2017  . BREAST CYST ASPIRATION    . CATARACT EXTRACTION Left   . COLON SURGERY  2010   pt. reports intestines burst  . EYE SURGERY     Left eye, retinal hole, Dr Manson Allan, at [facility] in [place] Left    09/2013  . REPLACEMENT TOTAL KNEE Right 2016  . RIGHT OOPHORECTOMY Right    during Diverticular surgery   Social history  reports that she has never smoked. She has never used smokeless tobacco. She reports that she drinks alcohol. She reports that she does not use drugs.  No Known Allergies  Family History  Problem Relation Age of Onset  . Heart disease Father 24  . Thyroid disease Mother   . Diabetes Mother        developed in her 36's  . Dementia Mother        mini strokes  . Heart disease Maternal Grandmother   . Prostate cancer Maternal Grandfather   . Heart disease Paternal Grandmother   . Heart disease Paternal Grandfather   . Colon cancer Neg Hx   . Esophageal cancer Neg Hx   . Stomach cancer Neg Hx   . Pancreatic cancer Neg Hx  Prior to Admission medications   Medication Sig Start Date End Date Taking? Authorizing Provider  Acetaminophen (TYLENOL EXTRA STRENGTH PO) Take by mouth as needed.    [provider]  Biotin 1000 MCG tablet Take 1,000 mcg by mouth daily.    [provider]  Multiple Vitamins-Minerals (MULTIVITAMIN ADULT PO) Take 1 tablet by mouth daily.    [provider]  Probiotic Product (DIGESTIVE ADVANTAGE) CAPS 1 capsule daily.  07/02/14   [provider]  VITAMIN D, CHOLECALCIFEROL, PO Take 200 Units by mouth daily. Pt. Reported    [provider]    Physical Exam: Vitals:   01/04/18 1115 01/04/18 1230 01/04/18 1301  01/04/18 1355  BP: 140/71 136/65 (!) 145/82 138/77  Pulse: 72 82 72 69  Resp: 16 (!) 22 13 14   Temp:    97.6 F (36.4 C)  TempSrc:    Oral  SpO2: 92% 94% 100% 100%  Weight:      Height:        Constitutional: NAD, calm, comfortable Vitals:   01/04/18 1115 01/04/18 1230 01/04/18 1301 01/04/18 1355  BP: 140/71 136/65 (!) 145/82 138/77  Pulse: 72 82 72 69  Resp: 16 (!) 22 13 14   Temp:    97.6 F (36.4 C)  TempSrc:    Oral  SpO2: 92% 94% 100% 100%  Weight:      Height:       Eyes: PERRL, lids and conjunctivae normal ENMT: Mucous membranes are dry. Posterior pharynx clear of any exudate or lesions.  NG tube is in place Neck: normal, supple, no masses, no thyromegaly Respiratory: bilateral decreased breath sounds at bases, no wheezing, no crackles. Normal respiratory effort. No accessory muscle use.  Cardiovascular: S1 S2 positive, rate controlled. No extremity edema. 2+ pedal pulses.  Abdomen: Mild epigastric/periumbilical tenderness, no rebound tenderness, no masses palpated. No hepatosplenomegaly. Bowel sounds sluggish. Musculoskeletal: no clubbing / cyanosis. No joint deformity upper and lower extremities.  Skin: no rashes, lesions, ulcers. No induration Neurologic: CN 2-12 grossly intact. Moving extremities. No focal neurologic deficits.  Psychiatric: Normal judgment and insight. Alert and oriented x 3. Normal mood.    Labs on Admission: I have personally reviewed following labs and imaging studies  CBC: Recent Labs  Lab 01/04/18 0820  WBC 10.4  NEUTROABS 9.0*  HGB 13.8  HCT 39.8  MCV 88.2  PLT 269   Basic Metabolic Panel: Recent Labs  Lab 01/04/18 0820  NA 138  K 4.0  CL 101  CO2 24  GLUCOSE 174*  BUN 36*  CREATININE 1.23*  CALCIUM 9.3   GFR: Estimated Creatinine Clearance: 36.1 mL/min (A) (by C-G formula based on SCr of 1.23 mg/dL (H)). Liver Function Tests: Recent Labs  Lab 01/04/18 0820  AST 31  ALT 20  ALKPHOS 70  BILITOT 0.7  PROT 8.1    ALBUMIN 4.2   Recent Labs  Lab 01/04/18 0820  LIPASE 39   No results for input(s): AMMONIA in the last 168 hours. Coagulation Profile: No results for input(s): INR, PROTIME in the last 168 hours. Cardiac Enzymes: No results for input(s): CKTOTAL, CKMB, CKMBINDEX, TROPONINI in the last 168 hours. BNP (last 3 results) No results for input(s): PROBNP in the last 8760 hours. HbA1C: No results for input(s): HGBA1C in the last 72 hours. CBG: No results for input(s): GLUCAP in the last 168 hours. Lipid Profile: No results for input(s): CHOL, HDL, LDLCALC, TRIG, CHOLHDL, LDLDIRECT in the last 72 hours. Thyroid Function Tests: No  results for input(s): TSH, T4TOTAL, FREET4, T3FREE, THYROIDAB in the last 72 hours. Anemia Panel: No results for input(s): VITAMINB12, FOLATE, FERRITIN, TIBC, IRON, RETICCTPCT in the last 72 hours. Urine analysis:    Component Value Date/Time   COLORURINE STRAW (A) 01/04/2018 1037   APPEARANCEUR CLEAR 01/04/2018 1037   LABSPEC <1.005 (L) 01/04/2018 1037   PHURINE 6.5 01/04/2018 1037   GLUCOSEU NEGATIVE 01/04/2018 1037   GLUCOSEU NEGATIVE 01/07/2015 0834   HGBUR TRACE (A) 01/04/2018 1037   BILIRUBINUR NEGATIVE 01/04/2018 1037   KETONESUR 15 (A) 01/04/2018 1037   PROTEINUR NEGATIVE 01/04/2018 1037   UROBILINOGEN 0.2 01/07/2015 0834   NITRITE NEGATIVE 01/04/2018 1037   LEUKOCYTESUR NEGATIVE 01/04/2018 1037    Radiological Exams on Admission: Ct Abdomen Pelvis W Contrast  Result Date: 01/04/2018 CLINICAL DATA:  Abdominal pain.  Concern for diverticulitis. EXAM: CT ABDOMEN AND PELVIS WITH CONTRAST TECHNIQUE: Multidetector CT imaging of the abdomen and pelvis was performed using the standard protocol following bolus administration of intravenous contrast. CONTRAST:  121mL ISOVUE-300 IOPAMIDOL (ISOVUE-300) INJECTION 61% COMPARISON:  None. FINDINGS: Lower chest: Limited visualization of the lower thorax demonstrates minimal dependent subpleural ground-glass  atelectasis. No discrete focal airspace opacities. Normal heart size.  No pericardial effusion. Hepatobiliary: Normal hepatic contour. No discrete hepatic lesions. Normal appearance of the gallbladder. No radiopaque gallstones. No intra extrahepatic biliary duct dilatation. No ascites. Pancreas: Normal appearance of the pancreas Spleen: Normal appearance of the spleen Adrenals/Urinary Tract: There is symmetric enhancement and excretion of the bilateral kidneys. Bilateral subcentimeter hypoattenuating renal lesions are too small to accurately characterize though favored to represent renal cysts. No definite renal stones this postcontrast examination. No urinary obstruction or perinephric stranding. Normal appearance the bilateral adrenal glands. Normal appearance of the urinary bladder given underdistention. Stomach/Bowel: There is moderate distention of several loops of small bowel within the lower abdomen with transition point located within the left mid hemi pelvis (image 65, series 2), with associated decompression of the downstream distal loops of small bowel, constellation of findings worrisome for small bowel obstruction. Normal appearance of the terminal ileum. The appendix is not visualized compatible with provided operative history. Large colonic stool burden. Scattered minimal colonic diverticulosis without definitive evidence of acute diverticulitis. Postsurgical change of the sigmoid colon (image 61, series 2). Vascular/Lymphatic: Scattered atherosclerotic plaque within a normal caliber abdominal aorta. The major branch vessels of the abdominal aorta appear widely patent. No bulky retroperitoneal, mesenteric, pelvic or inguinal lymphadenopathy. Reproductive: Appear normal for age. No discrete adnexal lesion. There is a small amount of fluid within the pelvic cul-de-sac, presumably reactive. Other: Regional soft tissues appear normal. Musculoskeletal: No acute or aggressive osseous abnormalities.  Bilateral facet degenerative change of the lower lumbar spine. IMPRESSION: 1. Findings worrisome for small bowel obstruction with transition point located within the left hemipelvis - the etiology of this transition point is not identified and thus presumably secondary to adhesions. No evidence of perforation or definable/drainable fluid collection. 2. Post appendectomy and the sequela of prior sigmoid colonic resection with primary anastomosis. 3. Scattered colonic diverticulosis without evidence of diverticulitis. 4.  Aortic Atherosclerosis (ICD10-I70.0). Electronically Signed   By: Sandi Mariscal M.D.   On: 01/04/2018 10:10   Dg Chest Portable 1 View  Result Date: 01/04/2018 CLINICAL DATA:  72 year old female with a history of nasogastric tube placement EXAM: PORTABLE CHEST 1 VIEW COMPARISON:  None. FINDINGS: Cardiomediastinal silhouette within normal limits. Coarsened interstitial markings with low lung volumes. No pneumothorax or pleural effusion. No confluent airspace disease.  No acute displaced fracture. Gastric tube terminates in the upper abdomen. IMPRESSION: Nasogastric tube terminates in the left upper quadrant. Low lung volumes with appearance of atelectasis/chronic lung changes. Electronically Signed   By: Corrie Mckusick D.O.   On: 01/04/2018 12:49     Assessment/Plan Active Problems:   SBO (small bowel obstruction) (HCC)   Small bowel obstruction causing abdominal pain and vomiting -General surgery consulted.  Continue NG tube.  IV fluids.  Pain management.  Antiemetics.  N.p.o. for now   DVT prophylaxis: SCDs Code Status: Full Family Communication: None at bedside Disposition Plan: Depends on clinical outcome.   Consults called: General surgery Admission status: Inpatient MedSurg  Severity of Illness: The appropriate patient status for this patient is INPATIENT. Inpatient status is judged to be reasonable and necessary in order to provide the required intensity of service to ensure  the patient's safety. The patient's presenting symptoms, physical exam findings, and initial radiographic and laboratory data in the context of their chronic comorbidities is felt to place them at high risk for further clinical deterioration. Furthermore, it is not anticipated that the patient will be medically stable for discharge from the hospital within 2 midnights of admission. The following factors support the patient status of inpatient.   " The patient's presenting symptoms include abdominal pain and vomiting. " The worrisome physical exam findings include abdominal tenderness. " The initial radiographic and laboratory data are worrisome because of bowel obstruction. " The chronic co-morbidities include none.   * I certify that at the point of admission it is my clinical judgment that the patient will require inpatient hospital care spanning beyond 2 midnights from the point of admission due to high intensity of service, high risk for further deterioration and high frequency of surveillance required.Aline August MD [facility] Pager [phone]  If 7PM-7AM, please contact night-coverage [email or web address] Password [facility]  01/04/2018, 3:33 PM

## 2018-01-04 NOTE — ED Provider Notes (Signed)
[facility] EMERGENCY DEPARTMENT Provider Note   CSN: [id number] Arrival date & time: 01/04/18  0800     History   Chief Complaint Chief Complaint  Patient presents with  . Abdominal Pain    HPI Catherine Prince is a 72 y.o. female.  Patient with acute onset of abdominal pain and persistent nausea and vomiting around 8 PM last evening.  Vomiting persisted throughout the night.  No diarrhea.  Abdominal pain is predominantly up in the epigastric area but it is all over.  Patient status post appendectomy and partial colon resection due to complications of diverticulitis in the past.  None recently.  Patient denies any fevers.  No blood in the vomit.     Past Medical History:  Diagnosis Date  . Allergy    mild seasonal allergies  . Arthritis   . Cancer ([place])   . Cataract    Left eye  . Diverticulitis   . Diverticulosis of colon without hemorrhage 12/09/2014  . H/O diverticulitis of colon 12/09/2014   7.5 inches removed after rupture in 2010, has done well since Also rook appendix and 1 ovary  . History of chicken pox   . Hyperlipidemia   . Impaired fasting glucose 12/09/2014  . Macular hole of left eye   . Measles   . Mumps   . Nocturia 12/09/2014  . Osteoarthritis 12/09/2014  . Osteopenia 04/01/2015  . Personal history of cardiac arrhythmia 03/30/2016  . Vitamin D deficiency     Patient Active Problem List   Diagnosis Date Noted  . Preventative health care 10/12/2016  . PVC's (premature ventricular contractions) 04/20/2016  . Recurrent BCC (basal cell carcinoma) 03/31/2016  . Personal history of cardiac arrhythmia 03/30/2016  . Osteopenia 04/01/2015  . Osteoarthritis 12/09/2014  . Nocturia 12/09/2014  . Diverticulosis of colon without hemorrhage 12/09/2014  . H/O diverticulitis of colon 12/09/2014  . Impaired fasting glucose 12/09/2014  . History of chicken pox   . Allergy   . Hyperlipidemia   . Vitamin D deficiency   . Macular hole of left eye     Past  Surgical History:  Procedure Laterality Date  . APPENDECTOMY    . BLEPHAROPLASTY Bilateral 08/02/2017  . BREAST CYST ASPIRATION    . CATARACT EXTRACTION Left   . COLON SURGERY  2010   pt. reports intestines burst  . EYE SURGERY     Left eye, retinal hole, Dr Manson Allan, at [facility] in [place] Left    09/2013  . REPLACEMENT TOTAL KNEE Right 2016  . RIGHT OOPHORECTOMY Right    during Diverticular surgery     OB History   None      Home Medications    Prior to Admission medications   Medication Sig Start Date End Date Taking? Authorizing Provider  Acetaminophen (TYLENOL EXTRA STRENGTH PO) Take by mouth as needed.    [provider]  Biotin 1000 MCG tablet Take 1,000 mcg by mouth daily.    [provider]  Multiple Vitamins-Minerals (MULTIVITAMIN ADULT PO) Take 1 tablet by mouth daily.    [provider]  Probiotic Product (DIGESTIVE ADVANTAGE) CAPS 1 capsule daily.  07/02/14   [provider]  VITAMIN D, CHOLECALCIFEROL, PO Take 200 Units by mouth daily. Pt. Reported    [provider]    Family History Family History  Problem Relation Age of Onset  . Heart disease Father 66  . Thyroid disease Mother   . Diabetes Mother  developed in her 87's  . Dementia Mother        mini strokes  . Heart disease Maternal Grandmother   . Prostate cancer Maternal Grandfather   . Heart disease Paternal Grandmother   . Heart disease Paternal Grandfather   . Colon cancer Neg Hx   . Esophageal cancer Neg Hx   . Stomach cancer Neg Hx   . Pancreatic cancer Neg Hx     Social History Social History   Tobacco Use  . Smoking status: Never Smoker  . Smokeless tobacco: Never Used  Substance Use Topics  . Alcohol use: Yes    Alcohol/week: 0.0 oz    Comment: occassional of wine  . Drug use: No     Allergies   Patient has no known allergies.   Review of Systems Review of Systems  Constitutional: Negative  for fever.  HENT: Negative for congestion.   Eyes: Negative for visual disturbance.  Respiratory: Negative for shortness of breath.   Cardiovascular: Negative for chest pain.  Gastrointestinal: Positive for abdominal pain. Negative for diarrhea, nausea and vomiting.  Genitourinary: Negative for dysuria.  Musculoskeletal: Negative for back pain.  Skin: Negative for rash.  Neurological: Negative for syncope.  Hematological: Does not bruise/bleed easily.  Psychiatric/Behavioral: Negative for confusion.     Physical Exam Updated Vital Signs BP (!) 149/84 (BP Location: Right Arm)   Pulse 85   Temp 97.9 F (36.6 C) (Oral)   Resp 18   Ht 1.575 m (5\' 2" )   Wt 61.2 kg (135 lb)   SpO2 96%   BMI 24.69 kg/m   Physical Exam  Constitutional: She is oriented to person, place, and time. She appears well-developed and well-nourished. No distress.  HENT:  Head: Normocephalic and atraumatic.  Mucous membranes slightly dry.  Eyes: Pupils are equal, round, and reactive to light. Conjunctivae and EOM are normal.  Neck: Neck supple.  Cardiovascular: Normal rate, regular rhythm and normal heart sounds.  Pulmonary/Chest: Effort normal and breath sounds normal. No respiratory distress.  Abdominal: Soft. Bowel sounds are normal. She exhibits distension. There is no tenderness.  Musculoskeletal: Normal range of motion. She exhibits no edema.  Neurological: She is alert and oriented to person, place, and time. No cranial nerve deficit or sensory deficit. She exhibits normal muscle tone. Coordination normal.  Skin: Skin is warm.  Nursing note and vitals reviewed.    ED Treatments / Results  Labs (all labs ordered are listed, but only abnormal results are displayed) Labs Reviewed  CBC WITH DIFFERENTIAL/PLATELET - Abnormal; Notable for the following components:      Result Value   Neutro Abs 9.0 (*)    All other components within normal limits  COMPREHENSIVE METABOLIC PANEL - Abnormal; Notable  for the following components:   Glucose, Bld 174 (*)    BUN 36 (*)    Creatinine, Ser 1.23 (*)    GFR calc non Af Amer 43 (*)    GFR calc Af Amer 50 (*)    All other components within normal limits  URINALYSIS, ROUTINE W REFLEX MICROSCOPIC - Abnormal; Notable for the following components:   Color, Urine STRAW (*)    Specific Gravity, Urine <1.005 (*)    Hgb urine dipstick TRACE (*)    Ketones, ur 15 (*)    All other components within normal limits  URINALYSIS, MICROSCOPIC (REFLEX) - Abnormal; Notable for the following components:   Bacteria, UA FEW (*)    All other components within normal limits  LIPASE, BLOOD    EKG None  Radiology Ct Abdomen Pelvis W Contrast  Result Date: 01/04/2018 CLINICAL DATA:  Abdominal pain.  Concern for diverticulitis. EXAM: CT ABDOMEN AND PELVIS WITH CONTRAST TECHNIQUE: Multidetector CT imaging of the abdomen and pelvis was performed using the standard protocol following bolus administration of intravenous contrast. CONTRAST:  137mL ISOVUE-300 IOPAMIDOL (ISOVUE-300) INJECTION 61% COMPARISON:  None. FINDINGS: Lower chest: Limited visualization of the lower thorax demonstrates minimal dependent subpleural ground-glass atelectasis. No discrete focal airspace opacities. Normal heart size.  No pericardial effusion. Hepatobiliary: Normal hepatic contour. No discrete hepatic lesions. Normal appearance of the gallbladder. No radiopaque gallstones. No intra extrahepatic biliary duct dilatation. No ascites. Pancreas: Normal appearance of the pancreas Spleen: Normal appearance of the spleen Adrenals/Urinary Tract: There is symmetric enhancement and excretion of the bilateral kidneys. Bilateral subcentimeter hypoattenuating renal lesions are too small to accurately characterize though favored to represent renal cysts. No definite renal stones this postcontrast examination. No urinary obstruction or perinephric stranding. Normal appearance the bilateral adrenal glands. Normal  appearance of the urinary bladder given underdistention. Stomach/Bowel: There is moderate distention of several loops of small bowel within the lower abdomen with transition point located within the left mid hemi pelvis (image 65, series 2), with associated decompression of the downstream distal loops of small bowel, constellation of findings worrisome for small bowel obstruction. Normal appearance of the terminal ileum. The appendix is not visualized compatible with provided operative history. Large colonic stool burden. Scattered minimal colonic diverticulosis without definitive evidence of acute diverticulitis. Postsurgical change of the sigmoid colon (image 61, series 2). Vascular/Lymphatic: Scattered atherosclerotic plaque within a normal caliber abdominal aorta. The major branch vessels of the abdominal aorta appear widely patent. No bulky retroperitoneal, mesenteric, pelvic or inguinal lymphadenopathy. Reproductive: Appear normal for age. No discrete adnexal lesion. There is a small amount of fluid within the pelvic cul-de-sac, presumably reactive. Other: Regional soft tissues appear normal. Musculoskeletal: No acute or aggressive osseous abnormalities. Bilateral facet degenerative change of the lower lumbar spine. IMPRESSION: 1. Findings worrisome for small bowel obstruction with transition point located within the left hemipelvis - the etiology of this transition point is not identified and thus presumably secondary to adhesions. No evidence of perforation or definable/drainable fluid collection. 2. Post appendectomy and the sequela of prior sigmoid colonic resection with primary anastomosis. 3. Scattered colonic diverticulosis without evidence of diverticulitis. 4.  Aortic Atherosclerosis (ICD10-I70.0). Electronically Signed   By: Sandi Mariscal M.D.   On: 01/04/2018 10:10    Procedures Procedures (including critical care time)  Medications Ordered in ED Medications  0.9 %  sodium chloride infusion (  Intravenous New Bag/Given 01/04/18 1045)  HYDROmorphone (DILAUDID) injection 0.5 mg (has no administration in time range)  ondansetron (ZOFRAN) injection 4 mg (has no administration in time range)  ondansetron (ZOFRAN) injection 4 mg (4 mg Intravenous Given 01/04/18 0843)  sodium chloride 0.9 % bolus 1,000 mL (0 mLs Intravenous Stopped 01/04/18 1036)  HYDROmorphone (DILAUDID) injection 0.5 mg (0.5 mg Intravenous Given 01/04/18 0843)  iopamidol (ISOVUE-300) 61 % injection 100 mL (100 mLs Intravenous Contrast Given 01/04/18 0930)     Initial Impression / Assessment and Plan / ED Course  I have reviewed the triage vital signs and the nursing notes.  Pertinent labs & imaging results that were available during my care of the patient were reviewed by me and considered in my medical decision making (see chart for details).     CT scans consistent with a small bowel  obstruction.  Will discuss with general surgery at [facility]  Suspect they may want hospitalist admission but will talk with general surgery first.  Discussed with Dr. Hassell Done from general surgery.  He will consult.  Recommending hospitalist admission.  Discussed with hospitalist at [facility] and accepted patient to [facility].  Patient's vital signs and labs without significant abnormality.  Patient will have NG tube placed.  Patient's been receiving IV fluids here.  I received a 1 L bolus.  And has maintenance fluids ordered.  Final Clinical Impressions(s) / ED Diagnoses   Final diagnoses:  Small bowel obstruction [facility])    ED Discharge Orders    None       Fredia Sorrow, MD 01/04/18 1248

## 2018-01-04 NOTE — ED Notes (Signed)
Patient transported to CT 

## 2018-01-04 NOTE — ED Triage Notes (Signed)
Abd pain and n/v since last night ,. Denies diarrhea. Vomiting in triage

## 2018-01-05 ENCOUNTER — Inpatient Hospital Stay (HOSPITAL_COMMUNITY): Payer: Medicare HMO

## 2018-01-05 DIAGNOSIS — K56609 Unspecified intestinal obstruction, unspecified as to partial versus complete obstruction: Secondary | ICD-10-CM

## 2018-01-05 LAB — BASIC METABOLIC PANEL
Anion gap: 8 (ref 5–15)
BUN: 26 mg/dL — ABNORMAL HIGH (ref 8–23)
CALCIUM: 8.3 mg/dL — AB (ref 8.9–10.3)
CO2: 27 mmol/L (ref 22–32)
CREATININE: 1.09 mg/dL — AB (ref 0.44–1.00)
Chloride: 110 mmol/L (ref 98–111)
GFR calc Af Amer: 58 mL/min — ABNORMAL LOW (ref >60)
GFR, EST NON AFRICAN AMERICAN: 50 mL/min — AB (ref >60)
Glucose, Bld: 117 mg/dL — ABNORMAL HIGH (ref 70–99)
Potassium: 4.3 mmol/L (ref 3.5–5.1)
Sodium: 145 mmol/L (ref 135–145)

## 2018-01-05 LAB — CBC
HEMATOCRIT: 35.8 % — AB (ref 36.0–46.0)
Hemoglobin: 11.9 g/dL — ABNORMAL LOW (ref 12.0–15.0)
MCH: 30.7 pg (ref 26.0–34.0)
MCHC: 33.2 g/dL (ref 30.0–36.0)
MCV: 92.5 fL (ref 78.0–100.0)
PLATELETS: 265 10*3/uL (ref 150–400)
RBC: 3.87 MIL/uL (ref 3.87–5.11)
RDW: 13.6 % (ref 11.5–15.5)
WBC: 11.7 10*3/uL — ABNORMAL HIGH (ref 4.0–10.5)

## 2018-01-05 LAB — GLUCOSE, CAPILLARY: Glucose-Capillary: 126 mg/dL — ABNORMAL HIGH (ref 70–99)

## 2018-01-05 MED ORDER — DIATRIZOATE MEGLUMINE & SODIUM 66-10 % PO SOLN
90.0000 mL | Freq: Once | ORAL | Status: AC
  Administered 2018-01-05: 90 mL via NASOGASTRIC
  Filled 2018-01-05: qty 90

## 2018-01-05 MED ORDER — PHENOL 1.4 % MT LIQD
1.0000 | OROMUCOSAL | Status: DC | PRN
  Administered 2018-01-05: 1 via OROMUCOSAL
  Filled 2018-01-05: qty 177

## 2018-01-05 NOTE — Progress Notes (Signed)
Pt. Demanded to get Iv morphine only 1mg .Wasted with Macanzi,Rn 1mg  to the sink.

## 2018-01-05 NOTE — Progress Notes (Signed)
[facility] PROGRESS NOTE    Progress Note  Catherine Prince  JIR:[id number] DOB: [birth year] DOA: 01/04/2018 PCP: Catherine Lukes, MD     Brief Narrative:   Catherine Prince is an 72 y.o. female past medical history of appendectomy, partial colon resection due to diverticulitis presents with acute onset of abdominal pain nausea and vomiting that started the day prior to admission, CT scan of the abdomen showed a small bowel obstruction surgery has been consulted.  Assessment/Plan:   Active Problems:   SBO (small bowel obstruction) ([address]) Has been consulted continue NG tube, IV fluids keep the patient n.p.o. We will encourage ambulation, monitor electrolytes and replete as needed. Get a physical therapy consult, small bowel protocol is pending.   DVT prophylaxis: lovenox Family Communication:none Disposition Plan/Barrier to D/C: unable to determine Code Status:     Code Status Orders  (From admission, onward)        Start     Ordered   01/04/18 1452  Full code  Continuous     01/04/18 1452    Code Status History    This patient has a current code status but no historical code status.    Advance Directive Documentation     Most Recent Value  Type of Advance Directive  Living will  Pre-existing out of facility DNR order (yellow form or pink MOST form)  -  "MOST" Form in Place?  -        IV Access:    Peripheral IV   Procedures and diagnostic studies:   Ct Abdomen Pelvis W Contrast  Result Date: 01/04/2018 CLINICAL DATA:  Abdominal pain.  Concern for diverticulitis. EXAM: CT ABDOMEN AND PELVIS WITH CONTRAST TECHNIQUE: Multidetector CT imaging of the abdomen and pelvis was performed using the standard protocol following bolus administration of intravenous contrast. CONTRAST:  124mL ISOVUE-300 IOPAMIDOL (ISOVUE-300) INJECTION 61% COMPARISON:  None. FINDINGS: Lower chest: Limited visualization of the lower thorax demonstrates minimal dependent subpleural ground-glass  atelectasis. No discrete focal airspace opacities. Normal heart size.  No pericardial effusion. Hepatobiliary: Normal hepatic contour. No discrete hepatic lesions. Normal appearance of the gallbladder. No radiopaque gallstones. No intra extrahepatic biliary duct dilatation. No ascites. Pancreas: Normal appearance of the pancreas Spleen: Normal appearance of the spleen Adrenals/Urinary Tract: There is symmetric enhancement and excretion of the bilateral kidneys. Bilateral subcentimeter hypoattenuating renal lesions are too small to accurately characterize though favored to represent renal cysts. No definite renal stones this postcontrast examination. No urinary obstruction or perinephric stranding. Normal appearance the bilateral adrenal glands. Normal appearance of the urinary bladder given underdistention. Stomach/Bowel: There is moderate distention of several loops of small bowel within the lower abdomen with transition point located within the left mid hemi pelvis (image 65, series 2), with associated decompression of the downstream distal loops of small bowel, constellation of findings worrisome for small bowel obstruction. Normal appearance of the terminal ileum. The appendix is not visualized compatible with provided operative history. Large colonic stool burden. Scattered minimal colonic diverticulosis without definitive evidence of acute diverticulitis. Postsurgical change of the sigmoid colon (image 61, series 2). Vascular/Lymphatic: Scattered atherosclerotic plaque within a normal caliber abdominal aorta. The major branch vessels of the abdominal aorta appear widely patent. No bulky retroperitoneal, mesenteric, pelvic or inguinal lymphadenopathy. Reproductive: Appear normal for age. No discrete adnexal lesion. There is a small amount of fluid within the pelvic cul-de-sac, presumably reactive. Other: Regional soft tissues appear normal. Musculoskeletal: No acute or aggressive osseous abnormalities.  Bilateral facet degenerative change  of the lower lumbar spine. IMPRESSION: 1. Findings worrisome for small bowel obstruction with transition point located within the left hemipelvis - the etiology of this transition point is not identified and thus presumably secondary to adhesions. No evidence of perforation or definable/drainable fluid collection. 2. Post appendectomy and the sequela of prior sigmoid colonic resection with primary anastomosis. 3. Scattered colonic diverticulosis without evidence of diverticulitis. 4.  Aortic Atherosclerosis (ICD10-I70.0). Electronically Signed   By: Sandi Mariscal M.D.   On: 01/04/2018 10:10   Dg Chest Portable 1 View  Result Date: 01/04/2018 CLINICAL DATA:  72 year old female with a history of nasogastric tube placement EXAM: PORTABLE CHEST 1 VIEW COMPARISON:  None. FINDINGS: Cardiomediastinal silhouette within normal limits. Coarsened interstitial markings with low lung volumes. No pneumothorax or pleural effusion. No confluent airspace disease. No acute displaced fracture. Gastric tube terminates in the upper abdomen. IMPRESSION: Nasogastric tube terminates in the left upper quadrant. Low lung volumes with appearance of atelectasis/chronic lung changes. Electronically Signed   By: Corrie Mckusick D.O.   On: 01/04/2018 12:49     Medical Consultants:    None.  Anti-Infectives:   None  Subjective:    Catherine Prince she relates her abdominal pain is better she does not feel nauseated.  Objective:    Vitals:   01/04/18 1301 01/04/18 1355 01/04/18 2115 01/05/18 0524  BP: (!) 145/82 138/77 133/67 (!) 145/83  Pulse: 72 69 84 79  Resp: 13 14 14    Temp:  97.6 F (36.4 C) 98.4 F (36.9 C) 98.1 F (36.7 C)  TempSrc:  Oral Oral Oral  SpO2: 100% 100% 98% 99%  Weight:      Height:        Intake/Output Summary (Last 24 hours) at 01/05/2018 0819 Last data filed at 01/05/2018 0600 Gross per 24 hour  Intake 2531.67 ml  Output 400 ml  Net 2131.67 ml   Filed  Weights   01/04/18 0807  Weight: 61.2 kg (135 lb)    Exam: General exam: In no acute distress. Respiratory system: Good air movement and clear to auscultation. Cardiovascular system: S1 & S2 heard, RRR.  Gastrointestinal system: Abdomen is nondistended, soft and nontender.  Central nervous system: Alert and oriented. No focal neurological deficits. Extremities: No pedal edema. Skin: No rashes, lesions or ulcers Psychiatry: Judgement and insight appear normal. Mood & affect appropriate.    Data Reviewed:    Labs: Basic Metabolic Panel: Recent Labs  Lab 01/04/18 0820 01/05/18 0502  NA 138 145  K 4.0 4.3  CL 101 110  CO2 24 27  GLUCOSE 174* 117*  BUN 36* 26*  CREATININE 1.23* 1.09*  CALCIUM 9.3 8.3*   GFR Estimated Creatinine Clearance: 40.7 mL/min (A) (by C-G formula based on SCr of 1.09 mg/dL (H)). Liver Function Tests: Recent Labs  Lab 01/04/18 0820  AST 31  ALT 20  ALKPHOS 70  BILITOT 0.7  PROT 8.1  ALBUMIN 4.2   Recent Labs  Lab 01/04/18 0820  LIPASE 39   No results for input(s): AMMONIA in the last 168 hours. Coagulation profile No results for input(s): INR, PROTIME in the last 168 hours.  CBC: Recent Labs  Lab 01/04/18 0820 01/05/18 0502  WBC 10.4 11.7*  NEUTROABS 9.0*  --   HGB 13.8 11.9*  HCT 39.8 35.8*  MCV 88.2 92.5  PLT 308 265   Cardiac Enzymes: No results for input(s): CKTOTAL, CKMB, CKMBINDEX, TROPONINI in the last 168 hours. BNP (last 3 results) No results for input(s):  PROBNP in the last 8760 hours. CBG: No results for input(s): GLUCAP in the last 168 hours. D-Dimer: No results for input(s): DDIMER in the last 72 hours. Hgb A1c: No results for input(s): HGBA1C in the last 72 hours. Lipid Profile: No results for input(s): CHOL, HDL, LDLCALC, TRIG, CHOLHDL, LDLDIRECT in the last 72 hours. Thyroid function studies: No results for input(s): TSH, T4TOTAL, T3FREE, THYROIDAB in the last 72 hours.  Invalid input(s):  FREET3 Anemia work up: No results for input(s): VITAMINB12, FOLATE, FERRITIN, TIBC, IRON, RETICCTPCT in the last 72 hours. Sepsis Labs: Recent Labs  Lab 01/04/18 0820 01/05/18 0502  WBC 10.4 11.7*   Microbiology No results found for this or any previous visit (from the past 240 hour(s)).   Medications:   . diatrizoate meglumine-sodium  90 mL Per NG tube Once   Continuous Infusions: . sodium chloride 100 mL/hr at 01/04/18 2311      LOS: 1 day   [facility] Pager [phone]  *Please refer to [email or web address], password [email or web address] to get updated schedule on who will round on this patient, as hospitalists switch teams weekly. If 7PM-7AM, please contact night-coverage at [email or web address], password [email or web address] for any overnight needs.  01/05/2018, 8:19 AM

## 2018-01-05 NOTE — Progress Notes (Signed)
Pt using BSC and NG tube came out. She had a large soft BM.Pt states feeling really good after having BM. She had 2 more BMs that were liquid .I have texted Dr. Aileen Fass x2 about the NG coming out and have not heard back from him. She continues to feel good, tolerating ice chips.

## 2018-01-05 NOTE — Progress Notes (Signed)
Patient ID: Catherine Prince, female   DOB: 07-Dec-1945, 72 y.o.   MRN: [id number] [facility] Surgery Progress Note:   * No surgery found *  Subjective: Mental status is clear.  Hasn't been walking Objective: Vital signs in last 24 hours: Temp:  [97.6 F (36.4 C)-98.4 F (36.9 C)] 98.1 F (36.7 C) (07/07 0524) Pulse Rate:  [69-85] 79 (07/07 0524) Resp:  [13-22] 14 (07/06 2115) BP: (133-149)/(65-84) 145/83 (07/07 0524) SpO2:  [92 %-100 %] 99 % (07/07 0524) Weight:  [61.2 kg (135 lb)] 61.2 kg (135 lb) (07/06 0807)  Intake/Output from previous day: 07/06 0701 - 07/07 0700 In: 2531.7 [I.V.:1501.7; NG/GT:30; IV Piggyback:1000] Out: 400 [Emesis/NG output:400] Intake/Output this shift: No intake/output data recorded.  Physical Exam: Work of breathing is normal.  Less distended and not much abdominal pain.  NG with some gastritis looking material  Lab Results:  Results for orders placed or performed during the hospital encounter of 01/04/18 (from the past 48 hour(s))  CBC with Differential     Status: Abnormal   Collection Time: 01/04/18  8:20 AM  Result Value Ref Range   WBC 10.4 4.0 - 10.5 K/uL   RBC 4.51 3.87 - 5.11 MIL/uL   Hemoglobin 13.8 12.0 - 15.0 g/dL   HCT 39.8 36.0 - 46.0 %   MCV 88.2 78.0 - 100.0 fL   MCH 30.6 26.0 - 34.0 pg   MCHC 34.7 30.0 - 36.0 g/dL   RDW 13.0 11.5 - 15.5 %   Platelets 308 150 - 400 K/uL   Neutrophils Relative % 87 %   Neutro Abs 9.0 (H) 1.7 - 7.7 K/uL   Lymphocytes Relative 8 %   Lymphs Abs 0.9 0.7 - 4.0 K/uL   Monocytes Relative 5 %   Monocytes Absolute 0.5 0.1 - 1.0 K/uL   Eosinophils Relative 0 %   Eosinophils Absolute 0.0 0.0 - 0.7 K/uL   Basophils Relative 0 %   Basophils Absolute 0.0 0.0 - 0.1 K/uL    Comment: Performed at [facility], [address] [place], [place] [address]  Comprehensive metabolic panel     Status: Abnormal   Collection Time: 01/04/18  8:20 AM  Result Value Ref Range   Sodium 138 135 - 145 mmol/L   Potassium 4.0 3.5 - 5.1 mmol/L   Chloride 101 98 - 111 mmol/L    Comment: Please note change in reference range.   CO2 24 22 - 32 mmol/L   Glucose, Bld 174 (H) 70 - 99 mg/dL    Comment: Please note change in reference range.   BUN 36 (H) 8 - 23 mg/dL    Comment: Please note change in reference range.   Creatinine, Ser 1.23 (H) 0.44 - 1.00 mg/dL   Calcium 9.3 8.9 - 10.3 mg/dL   Total Protein 8.1 6.5 - 8.1 g/dL   Albumin 4.2 3.5 - 5.0 g/dL   AST 31 15 - 41 U/L   ALT 20 0 - 44 U/L    Comment: Please note change in reference range.   Alkaline Phosphatase 70 38 - 126 U/L   Total Bilirubin 0.7 0.3 - 1.2 mg/dL   GFR calc non Af Amer 43 (L) >60 mL/min   GFR calc Af Amer 50 (L) >60 mL/min    Comment: (NOTE) The eGFR has been calculated using the CKD EPI equation. This calculation has not been validated in all clinical situations. eGFR's persistently <60 mL/min signify possible Chronic Kidney Disease.  Anion gap 13 5 - 15    Comment: Performed at [facility], [address] [place], [place] [address]  Lipase, blood     Status: None   Collection Time: 01/04/18  8:20 AM  Result Value Ref Range   Lipase 39 11 - 51 U/L    Comment: Performed at [facility], [address] [place], [place] [address]  Urinalysis, Routine w reflex microscopic     Status: Abnormal   Collection Time: 01/04/18 10:37 AM  Result Value Ref Range   Color, Urine STRAW (A) YELLOW   APPearance CLEAR CLEAR   Specific Gravity, Urine <1.005 (L) 1.005 - 1.030   pH 6.5 5.0 - 8.0   Glucose, UA NEGATIVE NEGATIVE mg/dL   Hgb urine dipstick TRACE (A) NEGATIVE   Bilirubin Urine NEGATIVE NEGATIVE   Ketones, ur 15 (A) NEGATIVE mg/dL   Protein, ur NEGATIVE NEGATIVE mg/dL   Nitrite NEGATIVE NEGATIVE   Leukocytes, UA NEGATIVE NEGATIVE    Comment: Performed at [facility], [address] [place], [place] [address]  Urinalysis, Microscopic (reflex)     Status: Abnormal   Collection Time:  01/04/18 10:37 AM  Result Value Ref Range   RBC / HPF 0-5 0 - 5 RBC/hpf   WBC, UA NONE SEEN 0 - 5 WBC/hpf   Bacteria, UA FEW (A) NONE SEEN   Squamous Epithelial / LPF 0-5 0 - 5    Comment: Performed at [facility], [address] [place], [place] [address]  CBC     Status: Abnormal   Collection Time: 01/05/18  5:02 AM  Result Value Ref Range   WBC 11.7 (H) 4.0 - 10.5 K/uL   RBC 3.87 3.87 - 5.11 MIL/uL   Hemoglobin 11.9 (L) 12.0 - 15.0 g/dL   HCT 35.8 (L) 36.0 - 46.0 %   MCV 92.5 78.0 - 100.0 fL   MCH 30.7 26.0 - 34.0 pg   MCHC 33.2 30.0 - 36.0 g/dL   RDW 13.6 11.5 - 15.5 %   Platelets 265 150 - 400 K/uL    Comment: Performed at [facility], [place] [address], [place], [place] [address]  Basic metabolic panel     Status: Abnormal   Collection Time: 01/05/18  5:02 AM  Result Value Ref Range   Sodium 145 135 - 145 mmol/L   Potassium 4.3 3.5 - 5.1 mmol/L   Chloride 110 98 - 111 mmol/L    Comment: Please note change in reference range.   CO2 27 22 - 32 mmol/L   Glucose, Bld 117 (H) 70 - 99 mg/dL    Comment: Please note change in reference range.   BUN 26 (H) 8 - 23 mg/dL    Comment: Please note change in reference range.   Creatinine, Ser 1.09 (H) 0.44 - 1.00 mg/dL   Calcium 8.3 (L) 8.9 - 10.3 mg/dL   GFR calc non Af Amer 50 (L) >60 mL/min   GFR calc Af Amer 58 (L) >60 mL/min    Comment: (NOTE) The eGFR has been calculated using the CKD EPI equation. This calculation has not been validated in all clinical situations. eGFR's persistently <60 mL/min signify possible Chronic Kidney Disease.    Anion gap 8 5 - 15    Comment: Performed at [facility], [place] [address], [place],  [address]    Radiology/Results: Ct Abdomen Pelvis W Contrast  Result Date: 01/04/2018 CLINICAL  DATA:  Abdominal pain.  Concern for diverticulitis. EXAM: CT ABDOMEN AND PELVIS WITH CONTRAST TECHNIQUE: Multidetector CT imaging of the abdomen and pelvis  was performed using the standard protocol following bolus administration of intravenous contrast. CONTRAST:  146m ISOVUE-300 IOPAMIDOL (ISOVUE-300) INJECTION 61% COMPARISON:  None. FINDINGS: Lower chest: Limited visualization of the lower thorax demonstrates minimal dependent subpleural ground-glass atelectasis. No discrete focal airspace opacities. Normal heart size.  No pericardial effusion. Hepatobiliary: Normal hepatic contour. No discrete hepatic lesions. Normal appearance of the gallbladder. No radiopaque gallstones. No intra extrahepatic biliary duct dilatation. No ascites. Pancreas: Normal appearance of the pancreas Spleen: Normal appearance of the spleen Adrenals/Urinary Tract: There is symmetric enhancement and excretion of the bilateral kidneys. Bilateral subcentimeter hypoattenuating renal lesions are too small to accurately characterize though favored to represent renal cysts. No definite renal stones this postcontrast examination. No urinary obstruction or perinephric stranding. Normal appearance the bilateral adrenal glands. Normal appearance of the urinary bladder given underdistention. Stomach/Bowel: There is moderate distention of several loops of small bowel within the lower abdomen with transition point located within the left mid hemi pelvis (image 65, series 2), with associated decompression of the downstream distal loops of small bowel, constellation of findings worrisome for small bowel obstruction. Normal appearance of the terminal ileum. The appendix is not visualized compatible with provided operative history. Large colonic stool burden. Scattered minimal colonic diverticulosis without definitive evidence of acute diverticulitis. Postsurgical change of the sigmoid colon (image 61, series 2). Vascular/Lymphatic: Scattered atherosclerotic plaque within a normal caliber abdominal aorta. The major branch vessels of the abdominal aorta appear widely patent. No bulky retroperitoneal,  mesenteric, pelvic or inguinal lymphadenopathy. Reproductive: Appear normal for age. No discrete adnexal lesion. There is a small amount of fluid within the pelvic cul-de-sac, presumably reactive. Other: Regional soft tissues appear normal. Musculoskeletal: No acute or aggressive osseous abnormalities. Bilateral facet degenerative change of the lower lumbar spine. IMPRESSION: 1. Findings worrisome for small bowel obstruction with transition point located within the left hemipelvis - the etiology of this transition point is not identified and thus presumably secondary to adhesions. No evidence of perforation or definable/drainable fluid collection. 2. Post appendectomy and the sequela of prior sigmoid colonic resection with primary anastomosis. 3. Scattered colonic diverticulosis without evidence of diverticulitis. 4.  Aortic Atherosclerosis (ICD10-I70.0). Electronically Signed   By: JSandi MariscalM.D.   On: 01/04/2018 10:10   Dg Chest Portable 1 View  Result Date: 01/04/2018 CLINICAL DATA:  72year old female with a history of nasogastric tube placement EXAM: PORTABLE CHEST 1 VIEW COMPARISON:  None. FINDINGS: Cardiomediastinal silhouette within normal limits. Coarsened interstitial markings with low lung volumes. No pneumothorax or pleural effusion. No confluent airspace disease. No acute displaced fracture. Gastric tube terminates in the upper abdomen. IMPRESSION: Nasogastric tube terminates in the left upper quadrant. Low lung volumes with appearance of atelectasis/chronic lung changes. Electronically Signed   By: JCorrie MckusickD.O.   On: 01/04/2018 12:49    Anti-infectives: Anti-infectives (From admission, onward)   None      Assessment/Plan: Problem List: Patient Active Problem List   Diagnosis Date Noted  . SBO (small bowel obstruction) ([place] 01/04/2018  . Preventative health care 10/12/2016  . PVC's (premature ventricular contractions) 04/20/2016  . Recurrent BCC (basal cell carcinoma)  03/31/2016  . Personal history of cardiac arrhythmia 03/30/2016  . Osteopenia 04/01/2015  . Osteoarthritis 12/09/2014  . Nocturia 12/09/2014  . Diverticulosis of colon without hemorrhage 12/09/2014  . H/O diverticulitis of colon 12/09/2014  .  Impaired fasting glucose 12/09/2014  . History of chicken pox   . Allergy   . Hyperlipidemia   . Vitamin D deficiency   . Macular hole of left eye     Will order the small bowel protocol to assess obstruction.   * No surgery found *    LOS: 1 day   Matt B. Hassell Done, MD, [facility] [phone] beeper [phone]  01/05/2018 8:03 AM

## 2018-01-06 MED ORDER — MORPHINE SULFATE (PF) 2 MG/ML IV SOLN: 2.0000 mg | INTRAVENOUS | 0 refills | Status: DC | PRN

## 2018-01-06 MED ORDER — ACETAMINOPHEN 325 MG PO TABS: 650.0000 mg | ORAL_TABLET | Freq: Four times a day (QID) | ORAL | Status: DC | PRN

## 2018-01-06 MED ORDER — PHENOL 1.4 % MT LIQD: 1.0000 | OROMUCOSAL | 0 refills | Status: DC | PRN

## 2018-01-06 MED ORDER — ONDANSETRON HCL 4 MG PO TABS: 4.0000 mg | ORAL_TABLET | Freq: Four times a day (QID) | ORAL | 0 refills | Status: DC | PRN

## 2018-01-06 NOTE — Progress Notes (Signed)
    CC: SBO  Subjective: Patient has had 4 bowel movements yesterday.  Tolerating clear liquids well.  Asking if she will be able to go home later today.  Objective: Vital signs in last 24 hours: Temp:  [97.6 F (36.4 C)-98.7 F (37.1 C)] 98.3 F (36.8 C) (07/08 0507) Pulse Rate:  [72-79] 73 (07/08 0507) Resp:  [16-19] 19 (07/08 0507) BP: (111-137)/(58-69) 111/58 (07/08 0507) SpO2:  [96 %-97 %] 96 % (07/08 0507) Last BM Date: 01/05/18 480 PO 2100 IV Urine x 4 Stool x 3 Afebrile, VSS Creatinine 1.09 WBC 11.7 yesterday Film last PM at [facility] shows contrast in the colon  Intake/Output from previous day: 07/07 0701 - 07/08 0700 In: 2600 [P.O.:480; I.V.:2100; NG/GT:20] Out: -  Intake/Output this shift: No intake/output data recorded.  General appearance: alert, cooperative and no distress Resp: clear to auscultation bilaterally GI: soft, non-tender; bowel sounds normal; no masses,  no organomegaly  Lab Results:  Recent Labs    01/04/18 0820 01/05/18 0502  WBC 10.4 11.7*  HGB 13.8 11.9*  HCT 39.8 35.8*  PLT 308 265    BMET Recent Labs    01/04/18 0820 01/05/18 0502  NA 138 145  K 4.0 4.3  CL 101 110  CO2 24 27  GLUCOSE 174* 117*  BUN 36* 26*  CREATININE 1.23* 1.09*  CALCIUM 9.3 8.3*   PT/INR No results for input(s): LABPROT, INR in the last 72 hours.  Recent Labs  Lab 01/04/18 0820  AST 31  ALT 20  ALKPHOS 70  BILITOT 0.7  PROT 8.1  ALBUMIN 4.2     Lipase     Component Value Date/Time   LIPASE 39 01/04/2018 0820     Prior to Admission medications   Medication Sig Start Date End Date Taking? Authorizing Provider  acetaminophen (TYLENOL) 500 MG tablet Take 500 mg by mouth at bedtime.   Yes [provider]  Biotin 1000 MCG tablet Take 1,000 mcg by mouth daily.   Yes [provider]  cholecalciferol (VITAMIN D) 1000 units tablet Take 2,000 Units by mouth daily.   Yes [provider]  Multiple Vitamins-Minerals  (MULTIVITAMIN ADULT PO) Take 1 tablet by mouth daily.   Yes [provider]  Probiotic Product (DIGESTIVE ADVANTAGE) CAPS Take 1 capsule by mouth daily.  07/02/14  Yes [provider]   . sodium chloride 100 mL/hr at 01/05/18 2341    Medications:   Assessment/Plan  SBO/prior appendectomy/colon resection/ right oophorectomy  FEN: IV fluids/clear liquids - last PM ID:  None DVT:  SCD Follow up:  TBD  Plan: Advance diet, she tolerates full liquids and a soft diet she should be able to go home and follow-up with her primary care.  I will keep her on a soft diet for the next 72 hours and then she can advance to a high-fiber diet.  LOS: 2 days    Derry Kassel 01/06/2018 [phone]

## 2018-01-06 NOTE — Progress Notes (Signed)
Pt has been drinking clear liquids and tolerating well.

## 2018-01-06 NOTE — Discharge Summary (Addendum)
Physician Discharge Summary  Catherine Prince NID:[id number] DOB: 01-20-1946 DOA: 01/04/2018  PCP: [name], MD  Admit date: 01/04/2018 Discharge date: 01/06/2018  Admitted From: home Disposition:  Home  Recommendations for Outpatient Follow-up:  1. Follow up with PCP in 1-2 weeks :  Home Health:no Equipment/Devices:none  Discharge Condition:stable CODE STATUS:full Diet recommendation: Heart Healthy  Brief/Interim Summary: 72 y.o. female past medical history of appendectomy, partial colon resection due to diverticulitis presents with acute onset of abdominal pain nausea and vomiting that started the day prior to admission, CT scan of the abdomen showed a small bowel obstruction surgery has been consulted.    Discharge Diagnoses:  Active Problems:   SBO (small bowel obstruction) ([place]) Surgery was consulted recommended conservative management small bowel protocol was done  NG tube was inserted, started on IV fluids and n.p.o., electrolytes were repleted as needed by the next day her nausea had resolved when she had passed gas she was started on a diet which she tolerated.  Discharge Instructions  Discharge Instructions    Diet - low sodium heart healthy   Complete by:  As directed    Increase activity slowly   Complete by:  As directed      Allergies as of 01/06/2018   No Known Allergies     Medication List    TAKE these medications   acetaminophen 500 MG tablet Commonly known as:  TYLENOL Take 500 mg by mouth at bedtime.   Biotin 1000 MCG tablet Take 1,000 mcg by mouth daily.   cholecalciferol 1000 units tablet Commonly known as:  VITAMIN D Take 2,000 Units by mouth daily.   DIGESTIVE ADVANTAGE Caps Take 1 capsule by mouth daily.   MULTIVITAMIN ADULT PO Take 1 tablet by mouth daily.   ondansetron 4 MG tablet Commonly known as:  ZOFRAN Take 1 tablet (4 mg total) by mouth every 6 (six) hours as needed for nausea.   phenol 1.4 % Liqd Commonly known as:   CHLORASEPTIC Use as directed 1 spray in the mouth or throat as needed for throat irritation / pain.       No Known Allergies  Consultations:  Surgery   Procedures/Studies: Ct Abdomen Pelvis W Contrast  Result Date: 01/04/2018 CLINICAL DATA:  Abdominal pain.  Concern for diverticulitis. EXAM: CT ABDOMEN AND PELVIS WITH CONTRAST TECHNIQUE: Multidetector CT imaging of the abdomen and pelvis was performed using the standard protocol following bolus administration of intravenous contrast. CONTRAST:  183mL ISOVUE-300 IOPAMIDOL (ISOVUE-300) INJECTION 61% COMPARISON:  None. FINDINGS: Lower chest: Limited visualization of the lower thorax demonstrates minimal dependent subpleural ground-glass atelectasis. No discrete focal airspace opacities. Normal heart size.  No pericardial effusion. Hepatobiliary: Normal hepatic contour. No discrete hepatic lesions. Normal appearance of the gallbladder. No radiopaque gallstones. No intra extrahepatic biliary duct dilatation. No ascites. Pancreas: Normal appearance of the pancreas Spleen: Normal appearance of the spleen Adrenals/Urinary Tract: There is symmetric enhancement and excretion of the bilateral kidneys. Bilateral subcentimeter hypoattenuating renal lesions are too small to accurately characterize though favored to represent renal cysts. No definite renal stones this postcontrast examination. No urinary obstruction or perinephric stranding. Normal appearance the bilateral adrenal glands. Normal appearance of the urinary bladder given underdistention. Stomach/Bowel: There is moderate distention of several loops of small bowel within the lower abdomen with transition point located within the left mid hemi pelvis (image 65, series 2), with associated decompression of the downstream distal loops of small bowel, constellation of findings worrisome for small bowel obstruction. Normal appearance  of the terminal ileum. The appendix is not visualized compatible with  provided operative history. Large colonic stool burden. Scattered minimal colonic diverticulosis without definitive evidence of acute diverticulitis. Postsurgical change of the sigmoid colon (image 61, series 2). Vascular/Lymphatic: Scattered atherosclerotic plaque within a normal caliber abdominal aorta. The major branch vessels of the abdominal aorta appear widely patent. No bulky retroperitoneal, mesenteric, pelvic or inguinal lymphadenopathy. Reproductive: Appear normal for age. No discrete adnexal lesion. There is a small amount of fluid within the pelvic cul-de-sac, presumably reactive. Other: Regional soft tissues appear normal. Musculoskeletal: No acute or aggressive osseous abnormalities. Bilateral facet degenerative change of the lower lumbar spine. IMPRESSION: 1. Findings worrisome for small bowel obstruction with transition point located within the left hemipelvis - the etiology of this transition point is not identified and thus presumably secondary to adhesions. No evidence of perforation or definable/drainable fluid collection. 2. Post appendectomy and the sequela of prior sigmoid colonic resection with primary anastomosis. 3. Scattered colonic diverticulosis without evidence of diverticulitis. 4.  Aortic Atherosclerosis (ICD10-I70.0). Electronically Signed   By: Sandi Mariscal M.D.   On: 01/04/2018 10:10   Dg Chest Portable 1 View  Result Date: 01/04/2018 CLINICAL DATA:  72 year old female with a history of nasogastric tube placement EXAM: PORTABLE CHEST 1 VIEW COMPARISON:  None. FINDINGS: Cardiomediastinal silhouette within normal limits. Coarsened interstitial markings with low lung volumes. No pneumothorax or pleural effusion. No confluent airspace disease. No acute displaced fracture. Gastric tube terminates in the upper abdomen. IMPRESSION: Nasogastric tube terminates in the left upper quadrant. Low lung volumes with appearance of atelectasis/chronic lung changes. Electronically Signed   By:  Corrie Mckusick D.O.   On: 01/04/2018 12:49   Dg Abd Portable 1v-small Bowel Obstruction Protocol-initial, 8 Hr Delay  Result Date: 01/05/2018 CLINICAL DATA:  Small bowel protocol. EXAM: PORTABLE ABDOMEN - 1 VIEW COMPARISON:  CT scan January 04, 2018 FINDINGS: There is contrast in the colon including in the rectum. No definitive small bowel dilatation seen on this study. No other interval changes. IMPRESSION: Contrast in the colon to the level the rectum. No definitive small bowel obstruction on this study. Electronically Signed   By: Dorise Bullion III M.D   On: 01/05/2018 18:52     Subjective: No complains  Discharge Exam: Vitals:   01/05/18 2124 01/06/18 0507  BP: 116/69 (!) 111/58  Pulse: 79 73  Resp: 19 19  Temp: 98.7 F (37.1 C) 98.3 F (36.8 C)  SpO2: 96% 96%   Vitals:   01/05/18 0524 01/05/18 1412 01/05/18 2124 01/06/18 0507  BP: (!) 145/83 137/69 116/69 (!) 111/58  Pulse: 79 72 79 73  Resp:  16 19 19   Temp: 98.1 F (36.7 C) 97.6 F (36.4 C) 98.7 F (37.1 C) 98.3 F (36.8 C)  TempSrc: Oral Oral Oral Oral  SpO2: 99% 97% 96% 96%  Weight:      Height:        General: Pt is alert, awake, not in acute distress Cardiovascular: RRR, S1/S2 +, no rubs, no gallops Respiratory: CTA bilaterally, no wheezing, no rhonchi Abdominal: Soft, NT, ND, bowel sounds + Extremities: no edema, no cyanosis    The results of significant diagnostics from this hospitalization (including imaging, microbiology, ancillary and laboratory) are listed below for reference.     Microbiology: No results found for this or any previous visit (from the past 240 hour(s)).   Labs: BNP (last 3 results) No results for input(s): BNP in the last 8760 hours. Basic Metabolic  Panel: Recent Labs  Lab 01/04/18 0820 01/05/18 0502  NA 138 145  K 4.0 4.3  CL 101 110  CO2 24 27  GLUCOSE 174* 117*  BUN 36* 26*  CREATININE 1.23* 1.09*  CALCIUM 9.3 8.3*   Liver Function Tests: Recent Labs  Lab  01/04/18 0820  AST 31  ALT 20  ALKPHOS 70  BILITOT 0.7  PROT 8.1  ALBUMIN 4.2   Recent Labs  Lab 01/04/18 0820  LIPASE 39   No results for input(s): AMMONIA in the last 168 hours. CBC: Recent Labs  Lab 01/04/18 0820 01/05/18 0502  WBC 10.4 11.7*  NEUTROABS 9.0*  --   HGB 13.8 11.9*  HCT 39.8 35.8*  MCV 88.2 92.5  PLT 308 265   Cardiac Enzymes: No results for input(s): CKTOTAL, CKMB, CKMBINDEX, TROPONINI in the last 168 hours. BNP: Invalid input(s): POCBNP CBG: Recent Labs  Lab 01/05/18 1143  GLUCAP 126*   D-Dimer No results for input(s): DDIMER in the last 72 hours. Hgb A1c No results for input(s): HGBA1C in the last 72 hours. Lipid Profile No results for input(s): CHOL, HDL, LDLCALC, TRIG, CHOLHDL, LDLDIRECT in the last 72 hours. Thyroid function studies No results for input(s): TSH, T4TOTAL, T3FREE, THYROIDAB in the last 72 hours.  Invalid input(s): FREET3 Anemia work up No results for input(s): VITAMINB12, FOLATE, FERRITIN, TIBC, IRON, RETICCTPCT in the last 72 hours. Urinalysis    Component Value Date/Time   COLORURINE STRAW (A) 01/04/2018 1037   APPEARANCEUR CLEAR 01/04/2018 1037   LABSPEC <1.005 (L) 01/04/2018 1037   PHURINE 6.5 01/04/2018 1037   GLUCOSEU NEGATIVE 01/04/2018 1037   GLUCOSEU NEGATIVE 01/07/2015 0834   HGBUR TRACE (A) 01/04/2018 1037   BILIRUBINUR NEGATIVE 01/04/2018 1037   KETONESUR 15 (A) 01/04/2018 1037   PROTEINUR NEGATIVE 01/04/2018 1037   UROBILINOGEN 0.2 01/07/2015 0834   NITRITE NEGATIVE 01/04/2018 1037   LEUKOCYTESUR NEGATIVE 01/04/2018 1037   Sepsis Labs Invalid input(s): PROCALCITONIN,  WBC,  LACTICIDVEN Microbiology No results found for this or any previous visit (from the past 240 hour(s)).   Time coordinating discharge: 40 minutes  SIGNED:   Charlynne Cousins, MD  [facility] 01/06/2018, 1:20 PM Pager   If 7PM-7AM, please contact night-coverage [email or web address] Password [email or web address]

## 2018-01-06 NOTE — Evaluation (Signed)
Physical Therapy Evaluation-1x Patient Details Name: Catherine Prince MRN: [id number] DOB: [birth year] Today's Date: 01/06/2018   History of Present Illness  72 yo female admitted with SBO  Clinical Impression  On eval, pt was Mod Ind with mobility. She walked ~250 feet around the unit. No LOB. No acute PT needs. Will sign off.     Follow Up Recommendations No PT follow up    Equipment Recommendations  None recommended by PT    Recommendations for Other Services       Precautions / Restrictions Precautions Precautions: None Restrictions Weight Bearing Restrictions: No      Mobility  Bed Mobility Overal bed mobility: Independent                Transfers Overall transfer level: Modified independent                  Ambulation/Gait Ambulation/Gait assistance: Modified independent (Device/Increase time) Gait Distance (Feet): 250 Feet Assistive device: None Gait Pattern/deviations: Drifts right/left     General Gait Details: mildly unsteady but no lob  Stairs            Wheelchair Mobility    Modified Rankin (Stroke Patients Only)       Balance Overall balance assessment: Mild deficits observed, not formally tested                                           Pertinent Vitals/Pain Pain Assessment: No/denies pain    Home Living Family/patient expects to be discharged to:: Private residence Living Arrangements: Spouse/significant other   Type of Home: House         Home Equipment: None      Prior Function Level of Independence: Independent               Hand Dominance        Extremity/Trunk Assessment   Upper Extremity Assessment Upper Extremity Assessment: Overall WFL for tasks assessed    Lower Extremity Assessment Lower Extremity Assessment: Generalized weakness    Cervical / Trunk Assessment Cervical / Trunk Assessment: Normal  Communication   Communication: No difficulties  Cognition  Arousal/Alertness: Awake/alert Behavior During Therapy: WFL for tasks assessed/performed Overall Cognitive Status: Within Functional Limits for tasks assessed                                        General Comments      Exercises     Assessment/Plan    PT Assessment Patent does not need any further PT services  PT Problem List         PT Treatment Interventions      PT Goals (Current goals can be found in the Care Plan section)  Acute Rehab PT Goals Patient Stated Goal: home PT Goal Formulation: All assessment and education complete, DC therapy    Frequency     Barriers to discharge        Co-evaluation               AM-PAC PT "6 Clicks" Daily Activity  Outcome Measure Difficulty turning over in bed (including adjusting bedclothes, sheets and blankets)?: None Difficulty moving from lying on back to sitting on the side of the bed? : None Difficulty sitting down on and standing up from a chair  with arms (e.g., wheelchair, bedside commode, etc,.)?: None Help needed moving to and from a bed to chair (including a wheelchair)?: None Help needed walking in hospital room?: None Help needed climbing 3-5 steps with a railing? : None 6 Click Score: 24    End of Session   Activity Tolerance: Patient tolerated treatment well Patient left: in bed        Time: 1350-1358 PT Time Calculation (min) (ACUTE ONLY): 8 min   Charges:   PT Evaluation $PT Eval Low Complexity: 1 Low     PT G Codes:          Weston Anna, MPT Pager: [phone]

## 2018-01-06 NOTE — Progress Notes (Signed)
Patient given discharge instructions, and verbalized an understanding of all discharge instructions.  Patient agrees with discharge plan, and is being discharged in stable medical condition.  Patient given transportation via wheelchair. 

## 2018-01-07 ENCOUNTER — Telehealth: Payer: Self-pay

## 2018-01-07 NOTE — Telephone Encounter (Signed)
01/07/18   Transition Care Management Follow-up Telephone Call  ADMISSION DATE: 01/04/18  DISCHARGE DATE: 01/06/18   How have you been since you were released from the hospital?  Feeling fine per patient.  Do you understand why you were in the hospital? Yes   Do you understand the discharge instrcutions? Yes      Items Reviewed:  Medications reviewed: Yes  Allergies reviewed:NKDA  Dietary changes reviewed: Low Sodium Heart Healthy   Referrals reviewed:Appointments scheduled for hospital follow up.   Functional Questionnaire:   Activities of Daily Living (ADLs): Patient can perform all independently.  Any patient concerns? Bloating   Confirmed importance and date/time of follow-up visits scheduled: Yes   Confirmed with patient if condition begins to worsen call PCP or go to the ER. Yes    Patient was given the office number and encouragred to call back with questions or concerns.Yes

## 2018-01-07 NOTE — Telephone Encounter (Signed)
01/07/18  

## 2018-01-08 ENCOUNTER — Encounter: Payer: Self-pay | Admitting: Family Medicine

## 2018-01-08 ENCOUNTER — Ambulatory Visit: Payer: Self-pay | Admitting: *Deleted

## 2018-01-08 ENCOUNTER — Other Ambulatory Visit: Payer: Self-pay | Admitting: Family Medicine

## 2018-01-08 MED ORDER — NYSTATIN 100000 UNIT/ML MT SUSP: 5.0000 mL | Freq: Four times a day (QID) | OROMUCOSAL | 0 refills | Status: DC

## 2018-01-08 NOTE — Telephone Encounter (Signed)
Patient discharged from hospital on 01/06/18. She noticed white patches on her tongue along with some sensitivity 2 days ago.  She also has a baker's cyst behind her left knee that appears larger than its usual size. No availability with PCP. Appointment scheduled for tomorrow morning.  Reason for Disposition . White patches that stick to tongue or inner cheek  Answer Assessment - Initial Assessment Questions 1. SYMPTOM: "What's the main symptom you're concerned about?" (e.g., dry mouth. chapped lips, lump)     White film on tongue 2. ONSET: "When did the    start?"     Last few days 3. PAIN: "Is there any pain?" If so, ask: "How bad is it?" (Scale: 1-10; mild, moderate, severe)     sensitive 4. CAUSE: "What do you think is causing the symptoms?"     Thrush 5. OTHER SYMPTOMS: "Do you have any other symptoms?" (e.g., fever, sore throat, toothache, swelling)     Food not tasting exactly right.  6. PREGNANCY: "Is there any chance you are pregnant?" "When was your last menstrual period?"     no  Protocols used: MOUTH [facility]

## 2018-01-09 ENCOUNTER — Ambulatory Visit (HOSPITAL_BASED_OUTPATIENT_CLINIC_OR_DEPARTMENT_OTHER)
Admission: RE | Admit: 2018-01-09 | Discharge: 2018-01-09 | Disposition: A | Discharge status: Home / Self Care | Payer: Medicare HMO | Source: Ambulatory Visit | Attending: Family | Admitting: Family

## 2018-01-09 ENCOUNTER — Ambulatory Visit: Payer: Medicare HMO | Admitting: Family

## 2018-01-09 ENCOUNTER — Encounter: Payer: Self-pay | Admitting: Family

## 2018-01-09 VITALS — BP 128/76 | HR 68 | Temp 98.5°F | Resp 18 | Ht 62.0 in | Wt 139.0 lb

## 2018-01-09 DIAGNOSIS — R509 Fever, unspecified: Secondary | ICD-10-CM

## 2018-01-09 DIAGNOSIS — R2242 Localized swelling, mass and lump, left lower limb: Secondary | ICD-10-CM | POA: Diagnosis not present

## 2018-01-09 DIAGNOSIS — K56609 Unspecified intestinal obstruction, unspecified as to partial versus complete obstruction: Secondary | ICD-10-CM | POA: Diagnosis not present

## 2018-01-09 DIAGNOSIS — M7122 Synovial cyst of popliteal space [Baker], left knee: Secondary | ICD-10-CM | POA: Diagnosis not present

## 2018-01-09 DIAGNOSIS — M7989 Other specified soft tissue disorders: Secondary | ICD-10-CM | POA: Diagnosis not present

## 2018-01-09 LAB — CBC WITH DIFFERENTIAL/PLATELET
BASOS PCT: 1 % (ref 0.0–3.0)
Basophils Absolute: 0.1 10*3/uL (ref 0.0–0.1)
Eosinophils Absolute: 0.2 10*3/uL (ref 0.0–0.7)
Eosinophils Relative: 2.9 % (ref 0.0–5.0)
HEMATOCRIT: 36.1 % (ref 36.0–46.0)
HEMOGLOBIN: 12.2 g/dL (ref 12.0–15.0)
LYMPHS PCT: 23.3 % (ref 12.0–46.0)
Lymphs Abs: 1.6 10*3/uL (ref 0.7–4.0)
MCHC: 33.8 g/dL (ref 30.0–36.0)
MCV: 90.2 fl (ref 78.0–100.0)
MONOS PCT: 12.8 % — AB (ref 3.0–12.0)
Monocytes Absolute: 0.9 10*3/uL (ref 0.1–1.0)
NEUTROS ABS: 4.2 10*3/uL (ref 1.4–7.7)
Neutrophils Relative %: 60 % (ref 43.0–77.0)
PLATELETS: 284 10*3/uL (ref 150.0–400.0)
RBC: 4.01 Mil/uL (ref 3.87–5.11)
RDW: 13.4 % (ref 11.5–15.5)
WBC: 7 10*3/uL (ref 4.0–10.5)

## 2018-01-09 LAB — COMPREHENSIVE METABOLIC PANEL
ALT: 17 U/L (ref 0–35)
AST: 22 U/L (ref 0–37)
Albumin: 3.8 g/dL (ref 3.5–5.2)
Alkaline Phosphatase: 63 U/L (ref 39–117)
BUN: 14 mg/dL (ref 6–23)
CALCIUM: 9.1 mg/dL (ref 8.4–10.5)
CHLORIDE: 103 meq/L (ref 96–112)
CO2: 31 meq/L (ref 19–32)
Creatinine, Ser: 1.06 mg/dL (ref 0.40–1.20)
GFR: 54.27 mL/min — AB (ref >60.00)
Glucose, Bld: 108 mg/dL — ABNORMAL HIGH (ref 70–99)
POTASSIUM: 4.4 meq/L (ref 3.5–5.1)
Sodium: 142 mEq/L (ref 135–145)
Total Bilirubin: 0.6 mg/dL (ref 0.2–1.2)
Total Protein: 6.7 g/dL (ref 6.0–8.3)

## 2018-01-09 NOTE — Patient Instructions (Addendum)
Please complete lab work prior to leaving. Complete chest x-ray on the first floor.  Return to imaging at 5:20 PM for your ultrasound.  Let us know if you have not moved your bowels in the next 3 days. Return to the ER if you develop fever >101, recurrent nausea/vomiting, or abdominal pain.

## 2018-01-09 NOTE — Progress Notes (Signed)
Subjective:    Patient ID: Catherine Prince, female    DOB: [birth year], 72 y.o.   MRN: [id number]  HPI  Patient is a 72 year old female who presents today for hospital follow-up.  Hospital discharge summary is reviewed.  She presented to the ER on January 04, 2018 with abdominal pain.  CT of the abdomen and pelvis was performed which showed a small bowel obstruction.  Surgery was consulted and she was placed on an NG tube.  Leg swelling-patient notes that she woke up yesterday morning with increased swelling behind the left knee.  She reports previous history of Baker's cyst. She reports that she came home on Monday.  Had some solids Monday AM.  Has not had a bowel Movement yet.  Still feels bloated.  Denies abdominal pain. Tolerating regular diet. Reports low grade temp tmax 99 at home.  Denies cough, urinary frequency or blood.   Review of Systems See HPI  Past Medical History:  Diagnosis Date  . Allergy    mild seasonal allergies  . Arthritis   . Cancer ([place])   . Cataract    Left eye  . Diverticulitis   . Diverticulosis of colon without hemorrhage 12/09/2014  . H/O diverticulitis of colon 12/09/2014   7.5 inches removed after rupture in 2010, has done well since Also rook appendix and 1 ovary  . History of chicken pox   . Hyperlipidemia   . Impaired fasting glucose 12/09/2014  . Macular hole of left eye   . Measles   . Mumps   . Nocturia 12/09/2014  . Osteoarthritis 12/09/2014  . Osteopenia 04/01/2015  . Personal history of cardiac arrhythmia 03/30/2016  . Vitamin D deficiency      Social History   Socioeconomic History  . Marital status: Married    Spouse name: Not on file  . Number of children: Not on file  . Years of education: 61  . Highest education level: Not on file  Occupational History  . Occupation: Web designer at [employer]  . Financial resource strain: Not on file  . Food insecurity:    Worry: Not on file    Inability: Not on file  .  Transportation needs:    Medical: Not on file    Non-medical: Not on file  Tobacco Use  . Smoking status: Never Smoker  . Smokeless tobacco: Never Used  Substance and Sexual Activity  . Alcohol use: Yes    Alcohol/week: 0.0 oz    Comment: occassional of wine  . Drug use: No  . Sexual activity: Yes    Comment: lives with husband, works with her church, no dietary restrictions  Lifestyle  . Physical activity:    Days per week: Not on file    Minutes per session: Not on file  . Stress: Not on file  Relationships  . Social connections:    Talks on phone: Not on file    Gets together: Not on file    Attends religious service: Not on file    Active member of club or organization: Not on file    Attends meetings of clubs or organizations: Not on file    Relationship status: Not on file  . Intimate partner violence:    Fear of current or ex partner: Not on file    Emotionally abused: Not on file    Physically abused: Not on file    Forced sexual activity: Not on file  Other Topics Concern  . Not on  file  Social History Narrative  . Not on file    Past Surgical History:  Procedure Laterality Date  . APPENDECTOMY    . BLEPHAROPLASTY Bilateral 08/02/2017  . BREAST CYST ASPIRATION    . CATARACT EXTRACTION Left   . COLON SURGERY  2010   pt. reports intestines burst  . EYE SURGERY     Left eye, retinal hole, Dr Manson Allan, at [facility] in [place] Left    09/2013  . REPLACEMENT TOTAL KNEE Right 2016  . RIGHT OOPHORECTOMY Right    during Diverticular surgery    Family History  Problem Relation Age of Onset  . Heart disease Father 52  . Thyroid disease Mother   . Diabetes Mother        developed in her 101's  . Dementia Mother        mini strokes  . Heart disease Maternal Grandmother   . Prostate cancer Maternal Grandfather   . Heart disease Paternal Grandmother   . Heart disease Paternal Grandfather   . Colon cancer Neg Hx   . Esophageal  cancer Neg Hx   . Stomach cancer Neg Hx   . Pancreatic cancer Neg Hx     No Known Allergies  Current Outpatient Medications on File Prior to Visit  Medication Sig Dispense Refill  . acetaminophen (TYLENOL) 500 MG tablet Take 500 mg by mouth at bedtime.    . Biotin 1000 MCG tablet Take 1,000 mcg by mouth daily.    . cholecalciferol (VITAMIN D) 1000 units tablet Take 2,000 Units by mouth daily.    . Multiple Vitamins-Minerals (MULTIVITAMIN ADULT PO) Take 1 tablet by mouth daily.    Marland Kitchen nystatin (MYCOSTATIN) 100000 UNIT/ML suspension Take 5 mLs (500,000 Units total) by mouth 4 (four) times daily. Swish and spit 60 mL 0  . ondansetron (ZOFRAN) 4 MG tablet Take 1 tablet (4 mg total) by mouth every 6 (six) hours as needed for nausea. 20 tablet 0  . phenol (CHLORASEPTIC) 1.4 % LIQD Use as directed 1 spray in the mouth or throat as needed for throat irritation / pain.  0  . Probiotic Product (DIGESTIVE ADVANTAGE) CAPS Take 1 capsule by mouth daily.      Current Facility-Administered Medications on File Prior to Visit  Medication Dose Route Frequency Provider Last Rate Last Dose  . 0.9 %  sodium chloride infusion  500 mL Intravenous Continuous Danis, Estill Cotta III, MD        BP 128/76 (BP Location: Right Arm, Cuff Size: Normal)   Pulse 68   Temp 98.5 F (36.9 C) (Oral)   Resp 18   Ht 5\' 2"  (1.575 m)   Wt 139 lb (63 kg)   SpO2 98%   BMI 25.42 kg/m       Objective:   Physical Exam  Constitutional: She appears well-developed and well-nourished.  Cardiovascular: Normal rate, regular rhythm and normal heart sounds.  No murmur heard. Pulmonary/Chest: Effort normal and breath sounds normal. No respiratory distress. She has no wheezes.  Abdominal: Soft. Bowel sounds are normal. She exhibits no distension and no mass. There is no tenderness. There is no rebound and no guarding.  Musculoskeletal:  + firm mass noted behind left knee t   Psychiatric: She has a normal mood and affect. Her behavior  is normal. Judgment and thought content normal.          Assessment & Plan:  Leg mass- check LE doppler to rule out  DVT.  Low grade fever- check cmet, cbc, CXR to further evaluate.  SBO- clinically resolved.  Tolerating POs, good bowel sounds on exam.  May take a few days before she moves her bowels again since she just started back on solids.

## 2018-01-16 ENCOUNTER — Inpatient Hospital Stay: Payer: Medicare HMO | Admitting: Family

## 2018-01-30 DIAGNOSIS — L720 Epidermal cyst: Secondary | ICD-10-CM | POA: Diagnosis not present

## 2018-01-30 DIAGNOSIS — D1801 Hemangioma of skin and subcutaneous tissue: Secondary | ICD-10-CM | POA: Diagnosis not present

## 2018-01-30 DIAGNOSIS — D225 Melanocytic nevi of trunk: Secondary | ICD-10-CM | POA: Diagnosis not present

## 2018-01-30 DIAGNOSIS — L821 Other seborrheic keratosis: Secondary | ICD-10-CM | POA: Diagnosis not present

## 2018-01-30 DIAGNOSIS — L814 Other melanin hyperpigmentation: Secondary | ICD-10-CM | POA: Diagnosis not present

## 2018-05-13 DIAGNOSIS — R69 Illness, unspecified: Secondary | ICD-10-CM | POA: Diagnosis not present

## 2018-06-11 DIAGNOSIS — Z23 Encounter for immunization: Secondary | ICD-10-CM | POA: Diagnosis not present

## 2018-08-11 DIAGNOSIS — G8929 Other chronic pain: Secondary | ICD-10-CM | POA: Diagnosis not present

## 2018-08-11 DIAGNOSIS — M25562 Pain in left knee: Secondary | ICD-10-CM | POA: Diagnosis not present

## 2018-08-11 DIAGNOSIS — M47816 Spondylosis without myelopathy or radiculopathy, lumbar region: Secondary | ICD-10-CM | POA: Diagnosis not present

## 2018-08-11 DIAGNOSIS — M25561 Pain in right knee: Secondary | ICD-10-CM | POA: Diagnosis not present

## 2018-08-11 DIAGNOSIS — M160 Bilateral primary osteoarthritis of hip: Secondary | ICD-10-CM | POA: Diagnosis not present

## 2018-08-11 DIAGNOSIS — Z96653 Presence of artificial knee joint, bilateral: Secondary | ICD-10-CM | POA: Diagnosis not present

## 2018-08-11 DIAGNOSIS — Z96652 Presence of left artificial knee joint: Secondary | ICD-10-CM | POA: Diagnosis not present

## 2018-08-11 DIAGNOSIS — Z471 Aftercare following joint replacement surgery: Secondary | ICD-10-CM | POA: Diagnosis not present

## 2018-10-20 NOTE — Progress Notes (Addendum)
Virtual Visit via Video Note  I connected with patient on 10/21/18 at 10:00 AM EDT by a video enabled telemedicine application and verified that I am speaking with the correct person using two identifiers.   THIS ENCOUNTER IS A VIRTUAL VISIT DUE TO COVID-19 - PATIENT WAS NOT SEEN IN THE OFFICE. PATIENT HAS CONSENTED TO VIRTUAL VISIT / TELEMEDICINE VISIT   Location of patient: home  Location of provider: office  I discussed the limitations of evaluation and management by telemedicine and the availability of in person appointments. The patient expressed understanding and agreed to proceed.   Subjective:   Catherine Prince is a 73 y.o. female who presents for Medicare Annual (Subsequent) preventive examination.  Review of Systems: No ROS.  Medicare Wellness Visit. Additional risk factors are reflected in the social history. Cardiac Risk Factors include: advanced age (>71men, >77 women);dyslipidemia Sleep patterns: Home Safety/Smoke Alarms: Feels safe in home. Smoke alarms in place.  Lives in 2 story home with husband and dog. No issues with stairs. Walk in shower.  Female:     Mammo-  11/21/17   Dexa scan- 10/30/16        CCS- 01/03/17. 5 yr recall    Objective:     Vitals: Unable to assess. This visit is enabled though telemedicine due to Covid 19.   Advanced Directives 10/21/2018 01/05/2018 01/04/2018 10/17/2017 10/12/2016 03/25/2015  Does Patient Have a Medical Advance Directive? Yes Yes Yes Yes Yes Yes  Type of Paramedic of [place];Living will - Living will [place];Living will [place];Living will [place];Living will  Does patient want to make changes to medical advance directive? No - Patient declined - No - Patient declined No - Patient declined Yes (Inpatient - patient requests chaplain consult to change a medical advance directive) No - Patient declined  Copy of [place] in Chart?  Yes - validated most recent copy scanned in chart (See row information) - No - copy requested No - copy requested No - copy requested No - copy requested    Tobacco Social History   Tobacco Use  Smoking Status Never Smoker  Smokeless Tobacco Never Used     Counseling given: Not Answered   Clinical Intake: Pain : No/denies pain   Past Medical History:  Diagnosis Date  . Allergy    mild seasonal allergies  . Arthritis   . Cancer ([place])   . Cataract    Left eye  . Diverticulitis   . Diverticulosis of colon without hemorrhage 12/09/2014  . H/O diverticulitis of colon 12/09/2014   7.5 inches removed after rupture in 2010, has done well since Also rook appendix and 1 ovary  . History of chicken pox   . Hyperlipidemia   . Impaired fasting glucose 12/09/2014  . Macular hole of left eye   . Measles   . Mumps   . Nocturia 12/09/2014  . Osteoarthritis 12/09/2014  . Osteopenia 04/01/2015  . Personal history of cardiac arrhythmia 03/30/2016  . Vitamin D deficiency    Past Surgical History:  Procedure Laterality Date  . APPENDECTOMY    . BLEPHAROPLASTY Bilateral 08/02/2017  . BREAST CYST ASPIRATION    . CATARACT EXTRACTION Left   . COLON SURGERY  2010   pt. reports intestines burst  . EYE SURGERY     Left eye, retinal hole, Dr Manson Allan, at [facility] in [place] Left    09/2013  .  REPLACEMENT TOTAL KNEE Right 2016  . RIGHT OOPHORECTOMY Right    during Diverticular surgery   Family History  Problem Relation Age of Onset  . Heart disease Father 25  . Thyroid disease Mother   . Diabetes Mother        developed in her 43's  . Dementia Mother        mini strokes  . Heart disease Maternal Grandmother   . Prostate cancer Maternal Grandfather   . Heart disease Paternal Grandmother   . Heart disease Paternal Grandfather   . Colon cancer Neg Hx   . Esophageal cancer Neg Hx   . Stomach cancer Neg Hx   . Pancreatic cancer Neg Hx    Social History    Socioeconomic History  . Marital status: Married    Spouse name: Not on file  . Number of children: Not on file  . Years of education: 76  . Highest education level: Not on file  Occupational History  . Occupation: Web designer at [employer]  . Financial resource strain: Not on file  . Food insecurity:    Worry: Not on file    Inability: Not on file  . Transportation needs:    Medical: Not on file    Non-medical: Not on file  Tobacco Use  . Smoking status: Never Smoker  . Smokeless tobacco: Never Used  Substance and Sexual Activity  . Alcohol use: Yes    Alcohol/week: 0.0 standard drinks    Comment: occassional of wine  . Drug use: No  . Sexual activity: Yes    Comment: lives with husband, works with her church, no dietary restrictions  Lifestyle  . Physical activity:    Days per week: Not on file    Minutes per session: Not on file  . Stress: Not on file  Relationships  . Social connections:    Talks on phone: Not on file    Gets together: Not on file    Attends religious service: Not on file    Active member of club or organization: Not on file    Attends meetings of clubs or organizations: Not on file    Relationship status: Not on file  Other Topics Concern  . Not on file  Social History Narrative  . Not on file    Outpatient Encounter Medications as of 10/21/2018  Medication Sig  . acetaminophen (TYLENOL) 500 MG tablet Take 500 mg by mouth at bedtime.  . Biotin 1000 MCG tablet Take 1,000 mcg by mouth daily.  . cholecalciferol (VITAMIN D) 1000 units tablet Take 2,000 Units by mouth daily.  . Multiple Vitamins-Minerals (MULTIVITAMIN ADULT PO) Take 1 tablet by mouth daily.  . Probiotic Product (DIGESTIVE ADVANTAGE) CAPS Take 1 capsule by mouth daily.   . [DISCONTINUED] nystatin (MYCOSTATIN) 100000 UNIT/ML suspension Take 5 mLs (500,000 Units total) by mouth 4 (four) times daily. Swish and spit  . [DISCONTINUED] ondansetron (ZOFRAN) 4 MG  tablet Take 1 tablet (4 mg total) by mouth every 6 (six) hours as needed for nausea.  . [DISCONTINUED] phenol (CHLORASEPTIC) 1.4 % LIQD Use as directed 1 spray in the mouth or throat as needed for throat irritation / pain.   Facility-Administered Encounter Medications as of 10/21/2018  Medication  . 0.9 %  sodium chloride infusion    Activities of Daily Living In your present state of health, do you have any difficulty performing the following activities: 10/21/2018 01/04/2018  Hearing? N N  Vision? N N  Comment wears contacts -  Difficulty concentrating or making decisions? N N  Walking or climbing stairs? N N  Dressing or bathing? N N  Doing errands, shopping? N N  Preparing Food and eating ? N -  Using the Toilet? N -  In the past six months, have you accidently leaked urine? N -  Do you have problems with loss of bowel control? N -  Managing your Medications? N -  Managing your Finances? N -  Housekeeping or managing your Housekeeping? N -  Some recent data might be hidden    Patient Care Team: [name], MD as PCP - General (Family Medicine) Nicole Kindred, MD as Referring Physician (Orthopedic Surgery)    Assessment:   This is a routine wellness examination for Catherine Prince. Physical assessment deferred to PCP.  Exercise Activities and Dietary recommendations Current Exercise Habits: Home exercise routine, Type of exercise: walking, Frequency (Times/Week): 7, Intensity: Mild, Exercise limited by: None identified   Diet (meal preparation, eat out, water intake, caffeinated beverages, dairy products, fruits and vegetables): in general, a "healthy" diet  , well balanced, on average, 3 meals per day    Goals    . Increase physical activity     * Exercise program at home        Fall Risk Fall Risk  10/21/2018 10/17/2017 10/12/2016 03/30/2016 03/25/2015  Falls in the past year? 0 No Yes Yes No  Number falls in past yr: - - 1 1 -  Comment - - - fell on a path walking her dog  -  Injury with Fall? - - No No -  Follow up - - Education provided;Falls prevention discussed - -    Depression Screen PHQ 2/9 Scores 10/21/2018 10/17/2017 10/12/2016 03/30/2016  PHQ - 2 Score 0 0 0 0     Cognitive Function Ad8 score reviewed for issues:  Issues making decisions:no  Less interest in hobbies / activities:no  Repeats questions, stories (family complaining):no  Trouble using ordinary gadgets (microwave, computer, phone):no  Forgets the month or year: no  Mismanaging finances: no  Remembering appts:no  Daily problems with thinking and/or memory:no Ad8 score is=0   MMSE - Mini Mental State Exam 03/25/2015  Orientation to time 5  Orientation to Place 5  Registration 3  Attention/ Calculation 5  Recall 3  Language- name 2 objects 2  Language- repeat 1  Language- follow 3 step command 3  Language- read & follow direction 1  Write a sentence 1  Copy design 1  Total score 30        Immunization History  Administered Date(s) Administered  . Influenza, High Dose Seasonal PF 03/25/2015, 03/30/2016  . Pneumococcal Conjugate-13 02/26/2014  . Pneumococcal Polysaccharide-23 03/30/2016  . Tdap 02/26/2014  . Zoster 08/27/2011    Screening Tests Health Maintenance  Topic Date Due  . INFLUENZA VACCINE  01/31/2019  . MAMMOGRAM  11/22/2019  . COLONOSCOPY  01/03/2022  . TETANUS/TDAP  02/27/2024  . DEXA SCAN  Completed  . Hepatitis C Screening  Completed  . PNA vac Low Risk Adult  Completed     Plan:     Please schedule your next medicare wellness visit with me in 1 yr.  Continue to eat heart healthy diet (full of fruits, vegetables, whole grains, lean protein, water--limit salt, fat, and sugar intake) and increase physical activity as tolerated.  Continue doing brain stimulating activities (puzzles, reading, adult coloring books, staying active.    I have personally reviewed  and noted the following in the patient's chart:   . Medical and social  history . Use of alcohol, tobacco or illicit drugs  . Current medications and supplements . Functional ability and status . Nutritional status . Physical activity . Advanced directives . List of other physicians . Hospitalizations, surgeries, and ER visits in previous 12 months . Vitals . Screenings to include cognitive, depression, and falls . Referrals and appointments  In addition, I have reviewed and discussed with patient certain preventive protocols, quality metrics, and best practice recommendations. A written personalized care plan for preventive services as well as general preventive health recommendations were provided to patient.     Shela Nevin, [place]  10/21/2018   Medical screening examination/treatment was performed by qualified clinical staff member and as supervising physician I was immediately available for consultation/collaboration. I have reviewed documentation and agree with assessment and plan.  Penni Homans, MD

## 2018-10-21 ENCOUNTER — Other Ambulatory Visit: Payer: Self-pay

## 2018-10-21 ENCOUNTER — Ambulatory Visit (INDEPENDENT_AMBULATORY_CARE_PROVIDER_SITE_OTHER): Payer: Medicare HMO | Admitting: *Deleted

## 2018-10-21 ENCOUNTER — Encounter: Payer: Medicare HMO | Admitting: Family Medicine

## 2018-10-21 ENCOUNTER — Encounter: Payer: Self-pay | Admitting: *Deleted

## 2018-10-21 DIAGNOSIS — Z0000 Encounter for general adult medical examination without abnormal findings: Secondary | ICD-10-CM

## 2018-10-21 NOTE — Patient Instructions (Signed)
Please schedule your next medicare wellness visit with me in 1 yr.  Continue to eat heart healthy diet (full of fruits, vegetables, whole grains, lean protein, water--limit salt, fat, and sugar intake) and increase physical activity as tolerated.  Continue doing brain stimulating activities (puzzles, reading, adult coloring books, staying active.   Catherine Prince , Thank you for taking time to come for your Medicare Wellness Visit. I appreciate your ongoing commitment to your health goals. Please review the following plan we discussed and let me know if I can assist you in the future.   These are the goals we discussed: Goals    . Increase physical activity     * Exercise program at home        This is a list of the screening recommended for you and due dates:  Health Maintenance  Topic Date Due  . Flu Shot  01/31/2019  . Mammogram  11/22/2019  . Colon Cancer Screening  01/03/2022  . Tetanus Vaccine  02/27/2024  . DEXA scan (bone density measurement)  Completed  .  Hepatitis C: One time screening is recommended by Center for Disease Control  (CDC) for  adults born from [birth year] through 1964.   Completed  . Pneumonia vaccines  Completed    Health Maintenance After Age 62 After age 30, you are at a higher risk for certain long-term diseases and infections as well as injuries from falls. Falls are a major cause of broken bones and head injuries in people who are older than age 53. Getting regular preventive care can help to keep you healthy and well. Preventive care includes getting regular testing and making lifestyle changes as recommended by your health care provider. Talk with your health care provider about:  Which screenings and tests you should have. A screening is a test that checks for a disease when you have no symptoms.  A diet and exercise plan that is right for you. What should I know about screenings and tests to prevent falls? Screening and testing are the best ways to  find a health problem early. Early diagnosis and treatment give you the best chance of managing medical conditions that are common after age 63. Certain conditions and lifestyle choices may make you more likely to have a fall. Your health care provider may recommend:  Regular vision checks. Poor vision and conditions such as cataracts can make you more likely to have a fall. If you wear glasses, make sure to get your prescription updated if your vision changes.  Medicine review. Work with your health care provider to regularly review all of the medicines you are taking, including over-the-counter medicines. Ask your health care provider about any side effects that may make you more likely to have a fall. Tell your health care provider if any medicines that you take make you feel dizzy or sleepy.  Osteoporosis screening. Osteoporosis is a condition that causes the bones to get weaker. This can make the bones weak and cause them to break more easily.  Blood pressure screening. Blood pressure changes and medicines to control blood pressure can make you feel dizzy.  Strength and balance checks. Your health care provider may recommend certain tests to check your strength and balance while standing, walking, or changing positions.  Foot health exam. Foot pain and numbness, as well as not wearing proper footwear, can make you more likely to have a fall.  Depression screening. You may be more likely to have a fall if you have  a fear of falling, feel emotionally low, or feel unable to do activities that you used to do.  Alcohol use screening. Using too much alcohol can affect your balance and may make you more likely to have a fall. What actions can I take to lower my risk of falls? General instructions  Talk with your health care provider about your risks for falling. Tell your health care provider if: ? You fall. Be sure to tell your health care provider about all falls, even ones that seem minor. ?  You feel dizzy, sleepy, or off-balance.  Take over-the-counter and prescription medicines only as told by your health care provider. These include any supplements.  Eat a healthy diet and maintain a healthy weight. A healthy diet includes low-fat dairy products, low-fat (lean) meats, and fiber from whole grains, beans, and lots of fruits and vegetables. Home safety  Remove any tripping hazards, such as rugs, cords, and clutter.  Install safety equipment such as grab bars in bathrooms and safety rails on stairs.  Keep rooms and walkways well-lit. Activity   Follow a regular exercise program to stay fit. This will help you maintain your balance. Ask your health care provider what types of exercise are appropriate for you.  If you need a cane or walker, use it as recommended by your health care provider.  Wear supportive shoes that have nonskid soles. Lifestyle  Do not drink alcohol if your health care provider tells you not to drink.  If you drink alcohol, limit how much you have: ? 0-1 drink a day for women. ? 0-2 drinks a day for men.  Be aware of how much alcohol is in your drink. In the [place] one drink equals one typical bottle of beer (12 oz), one-half glass of wine (5 oz), or one shot of hard liquor (1 oz).  Do not use any products that contain nicotine or tobacco, such as cigarettes and e-cigarettes. If you need help quitting, ask your health care provider. Summary  Having a healthy lifestyle and getting preventive care can help to protect your health and wellness after age 68.  Screening and testing are the best way to find a health problem early and help you avoid having a fall. Early diagnosis and treatment give you the best chance for managing medical conditions that are more common for people who are older than age 68.  Falls are a major cause of broken bones and head injuries in people who are older than age 75. Take precautions to prevent a fall at home.  Work with  your health care provider to learn what changes you can make to improve your health and wellness and to prevent falls. This information is not intended to replace advice given to you by your health care provider. Make sure you discuss any questions you have with your health care provider. Document Released: 05/01/2017 Document Revised: 05/01/2017 Document Reviewed: 05/01/2017 Elsevier Interactive Patient Education  2019 [employer].

## 2018-10-24 ENCOUNTER — Other Ambulatory Visit (HOSPITAL_BASED_OUTPATIENT_CLINIC_OR_DEPARTMENT_OTHER): Payer: Self-pay | Admitting: Family Medicine

## 2018-10-24 DIAGNOSIS — Z1231 Encounter for screening mammogram for malignant neoplasm of breast: Secondary | ICD-10-CM

## 2018-10-30 DIAGNOSIS — L821 Other seborrheic keratosis: Secondary | ICD-10-CM | POA: Diagnosis not present

## 2019-01-21 ENCOUNTER — Ambulatory Visit (HOSPITAL_BASED_OUTPATIENT_CLINIC_OR_DEPARTMENT_OTHER)
Admission: RE | Admit: 2019-01-21 | Discharge: 2019-01-21 | Disposition: A | Discharge status: Home / Self Care | Payer: Medicare HMO | Source: Ambulatory Visit | Attending: Family Medicine | Admitting: Family Medicine

## 2019-01-21 ENCOUNTER — Other Ambulatory Visit: Payer: Self-pay

## 2019-01-21 DIAGNOSIS — Z1231 Encounter for screening mammogram for malignant neoplasm of breast: Secondary | ICD-10-CM | POA: Diagnosis not present

## 2019-01-29 ENCOUNTER — Encounter: Payer: Self-pay | Admitting: Family Medicine

## 2019-01-29 ENCOUNTER — Ambulatory Visit (INDEPENDENT_AMBULATORY_CARE_PROVIDER_SITE_OTHER): Payer: Medicare HMO | Admitting: Family Medicine

## 2019-01-29 ENCOUNTER — Other Ambulatory Visit: Payer: Self-pay

## 2019-01-29 DIAGNOSIS — K56609 Unspecified intestinal obstruction, unspecified as to partial versus complete obstruction: Secondary | ICD-10-CM

## 2019-01-29 DIAGNOSIS — I493 Ventricular premature depolarization: Secondary | ICD-10-CM

## 2019-01-29 DIAGNOSIS — H269 Unspecified cataract: Secondary | ICD-10-CM | POA: Diagnosis not present

## 2019-01-29 DIAGNOSIS — H35342 Macular cyst, hole, or pseudohole, left eye: Secondary | ICD-10-CM

## 2019-01-29 DIAGNOSIS — Z0000 Encounter for general adult medical examination without abnormal findings: Secondary | ICD-10-CM

## 2019-01-29 DIAGNOSIS — E782 Mixed hyperlipidemia: Secondary | ICD-10-CM

## 2019-01-29 DIAGNOSIS — R7301 Impaired fasting glucose: Secondary | ICD-10-CM | POA: Diagnosis not present

## 2019-01-29 DIAGNOSIS — E559 Vitamin D deficiency, unspecified: Secondary | ICD-10-CM | POA: Diagnosis not present

## 2019-01-29 DIAGNOSIS — M1990 Unspecified osteoarthritis, unspecified site: Secondary | ICD-10-CM | POA: Diagnosis not present

## 2019-01-29 NOTE — Assessment & Plan Note (Signed)
Supplement and monitor 

## 2019-01-29 NOTE — Assessment & Plan Note (Signed)
[id number] acceptable, minimize simple carbs. Increase exercise as tolerated.  

## 2019-01-29 NOTE — Assessment & Plan Note (Signed)
No recent episodes

## 2019-01-29 NOTE — Assessment & Plan Note (Signed)
Is doing much better, no recent trouble. Is doing well, had surgery in [place] for a diverticular infection over 7 years ago. Continue fiber, psyllium. probiotic

## 2019-01-29 NOTE — Assessment & Plan Note (Signed)
Has an appt with optometry next month

## 2019-01-29 NOTE — Assessment & Plan Note (Signed)
Encouraged heart healthy diet, increase exercise, avoid trans fats, consider a krill oil cap daily 

## 2019-01-29 NOTE — Assessment & Plan Note (Signed)
Dr [facility] saw the patient

## 2019-01-30 LAB — TSH: TSH: 2.43 u[IU]/mL (ref 0.35–4.50)

## 2019-01-30 LAB — HEMOGLOBIN A1C: Hgb A1c MFr Bld: 5.8 % (ref 4.6–6.5)

## 2019-01-30 LAB — COMPREHENSIVE METABOLIC PANEL
ALT: 15 U/L (ref 0–35)
AST: 19 U/L (ref 0–37)
Albumin: 4.2 g/dL (ref 3.5–5.2)
Alkaline Phosphatase: 61 U/L (ref 39–117)
BUN: 31 mg/dL — ABNORMAL HIGH (ref 6–23)
CO2: 30 mEq/L (ref 19–32)
Calcium: 9.5 mg/dL (ref 8.4–10.5)
Chloride: 105 mEq/L (ref 96–112)
Creatinine, Ser: 1.13 mg/dL (ref 0.40–1.20)
GFR: 47.29 mL/min — ABNORMAL LOW (ref >60.00)
Glucose, Bld: 113 mg/dL — ABNORMAL HIGH (ref 70–99)
Potassium: 4.5 mEq/L (ref 3.5–5.1)
Sodium: 143 mEq/L (ref 135–145)
Total Bilirubin: 0.4 mg/dL (ref 0.2–1.2)
Total Protein: 7 g/dL (ref 6.0–8.3)

## 2019-01-30 LAB — LIPID PANEL
Cholesterol: 242 mg/dL — ABNORMAL HIGH (ref 0–200)
HDL: 77.6 mg/dL
LDL Cholesterol: 149 mg/dL — ABNORMAL HIGH (ref 0–99)
NonHDL: 164.7
Total CHOL/HDL Ratio: 3
Triglycerides: 80 mg/dL (ref 0.0–149.0)
VLDL: 16 mg/dL (ref 0.0–40.0)

## 2019-01-30 LAB — CBC
HCT: 39 % (ref 36.0–46.0)
Hemoglobin: 12.9 g/dL (ref 12.0–15.0)
MCHC: 33.2 g/dL (ref 30.0–36.0)
MCV: 91 fl (ref 78.0–100.0)
Platelets: 257 10*3/uL (ref 150.0–400.0)
RBC: 4.29 Mil/uL (ref 3.87–5.11)
RDW: 13.4 % (ref 11.5–15.5)
WBC: 7.6 10*3/uL (ref 4.0–10.5)

## 2019-01-30 LAB — VITAMIN D 25 HYDROXY (VIT D DEFICIENCY, FRACTURES): VITD: 46.75 ng/mL (ref 30.00–100.00)

## 2019-02-01 DIAGNOSIS — H269 Unspecified cataract: Secondary | ICD-10-CM | POA: Insufficient documentation

## 2019-02-01 NOTE — Progress Notes (Signed)
Subjective:    Patient ID: Catherine Prince, female    DOB: 22-Apr-1946, 73 y.o.   MRN: [id number]  No chief complaint on file.   HPI Patient is in today for annual preventative exam and follow up on chronic medical concerns including hyperlipidemia, cataracts, impaired fasting glucose and more. She feels well today. No recent febrile illness or hospitalizations. No polyuria or polydipsia. Is maintaining quarantine well. maintains a heart health diet and regular exercise. No acute concerns. She has not had a palpitations episode in a long time. Denies CP/palp/SOB/HA/congestion/fevers/GI or GU c/o. Taking meds as prescribed  Past Medical History:  Diagnosis Date  . Allergy    mild seasonal allergies  . Arthritis   . Cancer ([place])   . Cataract    Left eye  . Diverticulitis   . Diverticulosis of colon without hemorrhage 12/09/2014  . H/O diverticulitis of colon 12/09/2014   7.5 inches removed after rupture in 2010, has done well since Also rook appendix and 1 ovary  . History of chicken pox   . Hyperlipidemia   . Impaired fasting glucose 12/09/2014  . Macular hole of left eye   . Measles   . Mumps   . Nocturia 12/09/2014  . Osteoarthritis 12/09/2014  . Osteopenia 04/01/2015  . Personal history of cardiac arrhythmia 03/30/2016  . Vitamin D deficiency     Past Surgical History:  Procedure Laterality Date  . APPENDECTOMY    . BLEPHAROPLASTY Bilateral 08/02/2017  . BREAST CYST ASPIRATION    . CATARACT EXTRACTION Left   . COLON SURGERY  2010   pt. reports intestines burst  . EYE SURGERY     Left eye, retinal hole, Dr Manson Allan, at [facility] in [place] Left    09/2013  . REPLACEMENT TOTAL KNEE Right 2016  . RIGHT OOPHORECTOMY Right    during Diverticular surgery    Family History  Problem Relation Age of Onset  . Heart disease Father 64  . Thyroid disease Mother   . Diabetes Mother        developed in her 31's  . Dementia Mother        mini strokes  .  Heart disease Maternal Grandmother   . Prostate cancer Maternal Grandfather   . Heart disease Paternal Grandmother   . Heart disease Paternal Grandfather   . Colon cancer Neg Hx   . Esophageal cancer Neg Hx   . Stomach cancer Neg Hx   . Pancreatic cancer Neg Hx     Social History   Socioeconomic History  . Marital status: Married    Spouse name: Not on file  . Number of children: Not on file  . Years of education: 77  . Highest education level: Not on file  Occupational History  . Occupation: Web designer at [employer]  . Financial resource strain: Not on file  . Food insecurity    Worry: Not on file    Inability: Not on file  . Transportation needs    Medical: Not on file    Non-medical: Not on file  Tobacco Use  . Smoking status: Never Smoker  . Smokeless tobacco: Never Used  Substance and Sexual Activity  . Alcohol use: Yes    Alcohol/week: 0.0 standard drinks    Comment: occassional of wine  . Drug use: No  . Sexual activity: Yes    Comment: lives with husband, works with her church, no dietary restrictions  Lifestyle  .  Physical activity    Days per week: Not on file    Minutes per session: Not on file  . Stress: Not on file  Relationships  . Social Herbalist on phone: Not on file    Gets together: Not on file    Attends religious service: Not on file    Active member of club or organization: Not on file    Attends meetings of clubs or organizations: Not on file    Relationship status: Not on file  . Intimate partner violence    Fear of current or ex partner: Not on file    Emotionally abused: Not on file    Physically abused: Not on file    Forced sexual activity: Not on file  Other Topics Concern  . Not on file  Social History Narrative  . Not on file    Outpatient Medications Prior to Visit  Medication Sig Dispense Refill  . acetaminophen (TYLENOL) 500 MG tablet Take 500 mg by mouth at bedtime.    . Biotin  1000 MCG tablet Take 1,000 mcg by mouth daily.    . cholecalciferol (VITAMIN D) 1000 units tablet Take 2,000 Units by mouth daily.    . Multiple Vitamins-Minerals (MULTIVITAMIN ADULT PO) Take 1 tablet by mouth daily.    . Probiotic Product (DIGESTIVE ADVANTAGE) CAPS Take 1 capsule by mouth daily.     Marland Kitchen 0.9 %  sodium chloride infusion      No facility-administered medications prior to visit.     No Known Allergies  Review of Systems  Constitutional: Negative for chills, fever and malaise/fatigue.  HENT: Negative for congestion and hearing loss.   Eyes: Negative for discharge.  Respiratory: Negative for cough, sputum production and shortness of breath.   Cardiovascular: Negative for chest pain, palpitations and leg swelling.  Gastrointestinal: Negative for abdominal pain, blood in stool, constipation, diarrhea, heartburn, nausea and vomiting.  Genitourinary: Negative for dysuria, frequency, hematuria and urgency.  Musculoskeletal: Negative for back pain, falls and myalgias.  Skin: Negative for rash.  Neurological: Negative for dizziness, sensory change, loss of consciousness, weakness and headaches.  Endo/Heme/Allergies: Negative for environmental allergies. Does not bruise/bleed easily.  Psychiatric/Behavioral: Negative for depression and suicidal ideas. The patient is not nervous/anxious and does not have insomnia.        Objective:    Physical Exam Constitutional:      General: She is not in acute distress.    Appearance: She is well-developed.  HENT:     Head: Normocephalic and atraumatic.  Eyes:     Conjunctiva/sclera: Conjunctivae normal.  Neck:     Musculoskeletal: Neck supple.     Thyroid: No thyromegaly.  Cardiovascular:     Rate and Rhythm: Normal rate and regular rhythm.     Heart sounds: Normal heart sounds. No murmur.  Pulmonary:     Effort: Pulmonary effort is normal. No respiratory distress.     Breath sounds: Normal breath sounds.  Abdominal:     General:  Bowel sounds are normal. There is no distension.     Palpations: Abdomen is soft. There is no mass.     Tenderness: There is no abdominal tenderness.  Lymphadenopathy:     Cervical: No cervical adenopathy.  Skin:    General: Skin is warm and dry.  Neurological:     Mental Status: She is alert and oriented to person, place, and time.  Psychiatric:        Behavior: Behavior normal.  BP 110/70 (BP Location: Left Arm, Patient Position: Sitting, Cuff Size: Normal)   Pulse 78   Temp 97.8 F (36.6 C) (Oral)   Resp 18   Ht 5\' 2"  (1.575 m)   Wt 132 lb 6.4 oz (60.1 kg)   SpO2 97%   BMI 24.22 kg/m  Wt Readings from Last 3 Encounters:  01/29/19 132 lb 6.4 oz (60.1 kg)  01/09/18 139 lb (63 kg)  01/04/18 135 lb (61.2 kg)    Diabetic Foot Exam - Simple   No data filed     Lab Results  Component Value Date   WBC 7.6 01/29/2019   HGB 12.9 01/29/2019   HCT 39.0 01/29/2019   PLT 257.0 01/29/2019   GLUCOSE 113 (H) 01/29/2019   CHOL 242 (H) 01/29/2019   TRIG 80.0 01/29/2019   HDL 77.60 01/29/2019   LDLCALC 149 (H) 01/29/2019   ALT 15 01/29/2019   AST 19 01/29/2019   NA 143 01/29/2019   K 4.5 01/29/2019   CL 105 01/29/2019   CREATININE 1.13 01/29/2019   BUN 31 (H) 01/29/2019   CO2 30 01/29/2019   TSH 2.43 01/29/2019   HGBA1C 5.8 01/29/2019    Lab Results  Component Value Date   TSH 2.43 01/29/2019   Lab Results  Component Value Date   WBC 7.6 01/29/2019   HGB 12.9 01/29/2019   HCT 39.0 01/29/2019   MCV 91.0 01/29/2019   PLT 257.0 01/29/2019   Lab Results  Component Value Date   NA 143 01/29/2019   K 4.5 01/29/2019   CO2 30 01/29/2019   GLUCOSE 113 (H) 01/29/2019   BUN 31 (H) 01/29/2019   CREATININE 1.13 01/29/2019   BILITOT 0.4 01/29/2019   ALKPHOS 61 01/29/2019   AST 19 01/29/2019   ALT 15 01/29/2019   PROT 7.0 01/29/2019   ALBUMIN 4.2 01/29/2019   CALCIUM 9.5 01/29/2019   ANIONGAP 8 01/05/2018   GFR 47.29 (L) 01/29/2019   Lab Results  Component  Value Date   CHOL 242 (H) 01/29/2019   Lab Results  Component Value Date   HDL 77.60 01/29/2019   Lab Results  Component Value Date   LDLCALC 149 (H) 01/29/2019   Lab Results  Component Value Date   TRIG 80.0 01/29/2019   Lab Results  Component Value Date   CHOLHDL 3 01/29/2019   Lab Results  Component Value Date   HGBA1C 5.8 01/29/2019       Assessment & Plan:   Problem List Items Addressed This Visit    Osteoarthritis    Dr Treasa [facility] practice saw the patient       Hyperlipidemia    Encouraged heart healthy diet, increase exercise, avoid trans fats, consider a krill oil cap daily      Relevant Orders   Lipid panel (Completed)   Vitamin D deficiency    Supplement and monitor      Relevant Orders   CBC (Completed)   Comprehensive metabolic panel (Completed)   VITAMIN D 25 Hydroxy (Vit-D Deficiency, Fractures) (Completed)   Macular hole of left eye    Has an appt with optometry next month      Impaired fasting glucose    hgba1c acceptable, minimize simple carbs. Increase exercise as tolerated.       Relevant Orders   CBC (Completed)   Hemoglobin A1c (Completed)   PVC's (premature ventricular contractions)    No recent episodes      Relevant Orders   TSH (Completed)  Preventative health care    Patient encouraged to maintain heart healthy diet, regular exercise, adequate sleep. Consider daily probiotics. Take medications as prescribed. Labs ordered and reviewed.       SBO (small bowel obstruction) (HCC)    Is doing much better, no recent trouble. Is doing well, had surgery in [place] for a diverticular infection over 7 years ago. Continue fiber, psyllium. probiotic      Relevant Orders   CBC (Completed)   Cataract    Has had the left cataract removed already. Is considering havign right eye done.          I am having Joanne Chars maintain her Multiple Vitamins-Minerals (MULTIVITAMIN ADULT PO), Biotin, Digestive Advantage, acetaminophen,  and cholecalciferol. We will stop administering sodium chloride.  No orders of the defined types were placed in this encounter.    Penni Homans, MD

## 2019-02-01 NOTE — Assessment & Plan Note (Signed)
Patient encouraged to maintain heart healthy diet, regular exercise, adequate sleep. Consider daily probiotics. Take medications as prescribed. Labs ordered and reviewed 

## 2019-02-01 NOTE — Assessment & Plan Note (Signed)
Has had the left cataract removed already. Is considering havign right eye done.

## 2019-02-03 DIAGNOSIS — W57XXXA Bitten or stung by nonvenomous insect and other nonvenomous arthropods, initial encounter: Secondary | ICD-10-CM | POA: Diagnosis not present

## 2019-02-03 DIAGNOSIS — L821 Other seborrheic keratosis: Secondary | ICD-10-CM | POA: Diagnosis not present

## 2019-02-03 DIAGNOSIS — L814 Other melanin hyperpigmentation: Secondary | ICD-10-CM | POA: Diagnosis not present

## 2019-02-03 DIAGNOSIS — D225 Melanocytic nevi of trunk: Secondary | ICD-10-CM | POA: Diagnosis not present

## 2019-02-03 DIAGNOSIS — Z85828 Personal history of other malignant neoplasm of skin: Secondary | ICD-10-CM | POA: Diagnosis not present

## 2019-02-17 DIAGNOSIS — H524 Presbyopia: Secondary | ICD-10-CM | POA: Diagnosis not present

## 2019-03-02 DIAGNOSIS — R69 Illness, unspecified: Secondary | ICD-10-CM | POA: Diagnosis not present

## 2019-03-10 DIAGNOSIS — R69 Illness, unspecified: Secondary | ICD-10-CM | POA: Diagnosis not present

## 2019-03-18 DIAGNOSIS — Z01818 Encounter for other preprocedural examination: Secondary | ICD-10-CM | POA: Diagnosis not present

## 2019-03-18 DIAGNOSIS — H25811 Combined forms of age-related cataract, right eye: Secondary | ICD-10-CM | POA: Diagnosis not present

## 2019-03-18 DIAGNOSIS — H2511 Age-related nuclear cataract, right eye: Secondary | ICD-10-CM | POA: Diagnosis not present

## 2019-04-03 DIAGNOSIS — H25811 Combined forms of age-related cataract, right eye: Secondary | ICD-10-CM | POA: Diagnosis not present

## 2019-04-03 DIAGNOSIS — H2511 Age-related nuclear cataract, right eye: Secondary | ICD-10-CM | POA: Diagnosis not present

## 2019-08-10 DIAGNOSIS — R69 Illness, unspecified: Secondary | ICD-10-CM | POA: Diagnosis not present

## 2019-10-05 ENCOUNTER — Encounter: Payer: Self-pay | Admitting: Family Medicine

## 2019-11-24 ENCOUNTER — Telehealth: Payer: Self-pay | Admitting: Family Medicine

## 2019-11-24 NOTE — Telephone Encounter (Signed)
Left message for patient to call back and schedule Medicare Annual Wellness Visit (AWV) either virtually/audio only or in office. Whichever the patients preference is.  Last AWV 10/21/18; please schedule at anytime with Naaman Plummer, [facility]-SW Nurse Health Advisor.  This should be a 45 minute visit.

## 2019-12-09 ENCOUNTER — Telehealth: Payer: Self-pay

## 2019-12-09 ENCOUNTER — Encounter (HOSPITAL_BASED_OUTPATIENT_CLINIC_OR_DEPARTMENT_OTHER): Payer: Self-pay | Admitting: *Deleted

## 2019-12-09 ENCOUNTER — Inpatient Hospital Stay (HOSPITAL_BASED_OUTPATIENT_CLINIC_OR_DEPARTMENT_OTHER)
Admission: EM | Admit: 2019-12-09 | Discharge: 2019-12-12 | DRG: 392 | Disposition: A | Discharge status: Home / Self Care | Payer: Medicare HMO | Attending: Internal Medicine | Admitting: Internal Medicine

## 2019-12-09 ENCOUNTER — Emergency Department (HOSPITAL_BASED_OUTPATIENT_CLINIC_OR_DEPARTMENT_OTHER): Payer: Medicare HMO

## 2019-12-09 ENCOUNTER — Other Ambulatory Visit: Payer: Self-pay

## 2019-12-09 DIAGNOSIS — Z96653 Presence of artificial knee joint, bilateral: Secondary | ICD-10-CM | POA: Diagnosis present

## 2019-12-09 DIAGNOSIS — Z833 Family history of diabetes mellitus: Secondary | ICD-10-CM | POA: Diagnosis not present

## 2019-12-09 DIAGNOSIS — Z9842 Cataract extraction status, left eye: Secondary | ICD-10-CM | POA: Diagnosis not present

## 2019-12-09 DIAGNOSIS — Z823 Family history of stroke: Secondary | ICD-10-CM | POA: Diagnosis not present

## 2019-12-09 DIAGNOSIS — E785 Hyperlipidemia, unspecified: Secondary | ICD-10-CM | POA: Diagnosis present

## 2019-12-09 DIAGNOSIS — Z20822 Contact with and (suspected) exposure to covid-19: Secondary | ICD-10-CM | POA: Diagnosis not present

## 2019-12-09 DIAGNOSIS — Z8349 Family history of other endocrine, nutritional and metabolic diseases: Secondary | ICD-10-CM

## 2019-12-09 DIAGNOSIS — B370 Candidal stomatitis: Secondary | ICD-10-CM | POA: Diagnosis present

## 2019-12-09 DIAGNOSIS — Z8249 Family history of ischemic heart disease and other diseases of the circulatory system: Secondary | ICD-10-CM

## 2019-12-09 DIAGNOSIS — Z90721 Acquired absence of ovaries, unilateral: Secondary | ICD-10-CM | POA: Diagnosis not present

## 2019-12-09 DIAGNOSIS — N182 Chronic kidney disease, stage 2 (mild): Secondary | ICD-10-CM | POA: Diagnosis present

## 2019-12-09 DIAGNOSIS — R109 Unspecified abdominal pain: Secondary | ICD-10-CM | POA: Diagnosis not present

## 2019-12-09 DIAGNOSIS — K625 Hemorrhage of anus and rectum: Secondary | ICD-10-CM | POA: Diagnosis present

## 2019-12-09 DIAGNOSIS — J309 Allergic rhinitis, unspecified: Secondary | ICD-10-CM | POA: Diagnosis present

## 2019-12-09 DIAGNOSIS — K529 Noninfective gastroenteritis and colitis, unspecified: Principal | ICD-10-CM | POA: Diagnosis present

## 2019-12-09 LAB — COMPREHENSIVE METABOLIC PANEL
ALT: 22 U/L (ref 0–44)
AST: 27 U/L (ref 15–41)
Albumin: 4 g/dL (ref 3.5–5.0)
Alkaline Phosphatase: 59 U/L (ref 38–126)
Anion gap: 8 (ref 5–15)
BUN: 28 mg/dL — ABNORMAL HIGH (ref 8–23)
CO2: 24 mmol/L (ref 22–32)
Calcium: 8.8 mg/dL — ABNORMAL LOW (ref 8.9–10.3)
Chloride: 103 mmol/L (ref 98–111)
Creatinine, Ser: 1.15 mg/dL — ABNORMAL HIGH (ref 0.44–1.00)
GFR calc Af Amer: 55 mL/min — ABNORMAL LOW (ref >60)
GFR calc non Af Amer: 47 mL/min — ABNORMAL LOW (ref >60)
Glucose, Bld: 136 mg/dL — ABNORMAL HIGH (ref 70–99)
Potassium: 4.6 mmol/L (ref 3.5–5.1)
Sodium: 135 mmol/L (ref 135–145)
Total Bilirubin: 0.7 mg/dL (ref 0.3–1.2)
Total Protein: 7.3 g/dL (ref 6.5–8.1)

## 2019-12-09 LAB — CBC WITH DIFFERENTIAL/PLATELET
Abs Immature Granulocytes: 0.04 10*3/uL (ref 0.00–0.07)
Basophils Absolute: 0 10*3/uL (ref 0.0–0.1)
Basophils Relative: 0 %
Eosinophils Absolute: 0 10*3/uL (ref 0.0–0.5)
Eosinophils Relative: 0 %
HCT: 39.1 % (ref 36.0–46.0)
Hemoglobin: 13.2 g/dL (ref 12.0–15.0)
Immature Granulocytes: 0 %
Lymphocytes Relative: 7 %
Lymphs Abs: 0.8 10*3/uL (ref 0.7–4.0)
MCH: 30.3 pg (ref 26.0–34.0)
MCHC: 33.8 g/dL (ref 30.0–36.0)
MCV: 89.9 fL (ref 80.0–100.0)
Monocytes Absolute: 0.6 10*3/uL (ref 0.1–1.0)
Monocytes Relative: 6 %
Neutro Abs: 9.7 10*3/uL — ABNORMAL HIGH (ref 1.7–7.7)
Neutrophils Relative %: 87 %
Platelets: 253 10*3/uL (ref 150–400)
RBC: 4.35 MIL/uL (ref 3.87–5.11)
RDW: 12.9 % (ref 11.5–15.5)
WBC: 11.2 10*3/uL — ABNORMAL HIGH (ref 4.0–10.5)
nRBC: 0 % (ref 0.0–0.2)

## 2019-12-09 LAB — LIPASE, BLOOD: Lipase: 39 U/L (ref 11–51)

## 2019-12-09 LAB — SARS CORONAVIRUS 2 BY RT PCR (HOSPITAL ORDER, PERFORMED IN ~~LOC~~ HOSPITAL LAB): SARS Coronavirus 2: NEGATIVE

## 2019-12-09 LAB — OCCULT BLOOD X 1 CARD TO LAB, STOOL: Fecal Occult Bld: POSITIVE — AB

## 2019-12-09 MED ORDER — ONDANSETRON HCL 4 MG/2ML IJ SOLN
4.0000 mg | Freq: Once | INTRAMUSCULAR | Status: AC
  Administered 2019-12-09: 4 mg via INTRAVENOUS
  Filled 2019-12-09: qty 2

## 2019-12-09 MED ORDER — IOHEXOL 300 MG/ML  SOLN
100.0000 mL | Freq: Once | INTRAMUSCULAR | Status: AC | PRN
  Administered 2019-12-09: 100 mL via INTRAVENOUS

## 2019-12-09 MED ORDER — MORPHINE SULFATE (PF) 4 MG/ML IV SOLN
4.0000 mg | Freq: Once | INTRAVENOUS | Status: AC
  Administered 2019-12-09: 4 mg via INTRAVENOUS
  Filled 2019-12-09: qty 1

## 2019-12-09 MED ORDER — SODIUM CHLORIDE 0.9 % IV BOLUS
1000.0000 mL | Freq: Once | INTRAVENOUS | Status: AC
  Administered 2019-12-09: 1000 mL via INTRAVENOUS

## 2019-12-09 NOTE — Telephone Encounter (Signed)
Patient called in to speak with the nurse to get some advice on making an appointment with another office. Please call the patient at [phone]

## 2019-12-09 NOTE — ED Triage Notes (Addendum)
Pt c/o diffuse abd  Last night  With  Bright red rectal bleeding , pain improved today

## 2019-12-09 NOTE — ED Notes (Signed)
ED Provider at bedside. 

## 2019-12-09 NOTE — Telephone Encounter (Signed)
Spoke with patient and she stated that she was having some cramping and diarrhea that started at 1am with blood.  Right now the diarrhea has stopped.  When she has the feeling to go to the bathroom nothing comes out now but a puddle of blood.  Also having some dizziness.  She thinks it may be food poisoning.  Advised patient to head to the nearest UC or ER to be evaluated they may have to do xray, scans, or give her fluids.  She agreed to go to nearest ER to be evaluated.

## 2019-12-09 NOTE — ED Provider Notes (Signed)
[facility] EMERGENCY DEPARTMENT Provider Note   CSN: [id number] Arrival date & time: 12/09/19  1432     History Chief Complaint  Patient presents with  . Rectal Bleeding    Catherine Prince is a 74 y.o. female.  HPI      74 year old female with a history of diverticulitis with history of perforation and partial colectomy in 2010, hyperlipidemia, SBO, presents with concern for abdominal cramping and rectal bleeding.  Reports that both she and her husband had lasagna last night, and approximately 1 AM she worked woke up with diffuse abdominal cramping and pain.  Her husband had some gastrointestinal symptoms this morning that were less severe.  Last night, after she developed the severe pain, she had a bowel movement which began as normal, and progressed to diarrhea followed by bright red blood.  Reports has not had any additional bowel movements today, but she has had approximately 5 episodes of rectal bleeding.  It is difficult to quantify the amount of blood, but reports bright red blood and some very small clots.  The more severe abdominal pain she is having last night has now resolved, but she continues to have a discomfort on the left side of her abdomen rated a 3 out of 10.  Reports nausea but denies vomiting.  Denies fevers, but did have chills and diaphoresis as she had diarrhea last night.  No urinary symptoms.  Does note some lightheadedness but drank gatorade to rehydrate and felt better. Not on anticoagulation. No hx of GI bleed. THinks saw [facility] for colonoscopy.  Past Medical History:  Diagnosis Date  . Allergy    mild seasonal allergies  . Arthritis   . Cancer ([place])   . Cataract    Left eye  . Diverticulitis   . Diverticulosis of colon without hemorrhage 12/09/2014  . H/O diverticulitis of colon 12/09/2014   7.5 inches removed after rupture in 2010, has done well since Also rook appendix and 1 ovary  . History of chicken pox   . Hyperlipidemia   . Impaired  fasting glucose 12/09/2014  . Macular hole of left eye   . Measles   . Mumps   . Nocturia 12/09/2014  . Osteoarthritis 12/09/2014  . Osteopenia 04/01/2015  . Personal history of cardiac arrhythmia 03/30/2016  . Vitamin D deficiency     Patient Active Problem List   Diagnosis Date Noted  . Rectal bleeding 12/10/2019  . Colitis 12/09/2019  . Cataract 02/01/2019  . SBO (small bowel obstruction) ([place]) 01/04/2018  . Preventative health care 10/12/2016  . PVC's (premature ventricular contractions) 04/20/2016  . Recurrent BCC (basal cell carcinoma) 03/31/2016  . Personal history of cardiac arrhythmia 03/30/2016  . Osteopenia 04/01/2015  . Osteoarthritis 12/09/2014  . Nocturia 12/09/2014  . Diverticulosis of colon without hemorrhage 12/09/2014  . H/O diverticulitis of colon 12/09/2014  . Impaired fasting glucose 12/09/2014  . History of chicken pox   . Allergy   . Hyperlipidemia   . Vitamin D deficiency   . Macular hole of left eye     Past Surgical History:  Procedure Laterality Date  . APPENDECTOMY    . BLEPHAROPLASTY Bilateral 08/02/2017  . BREAST CYST ASPIRATION    . CATARACT EXTRACTION Left   . COLON SURGERY  2010   pt. reports intestines burst  . EYE SURGERY     Left eye, retinal hole, Dr Manson Allan, at [facility] in [place] Left    09/2013  .  REPLACEMENT TOTAL KNEE Right 2016  . RIGHT OOPHORECTOMY Right    during Diverticular surgery     OB History   No obstetric history on file.     Family History  Problem Relation Age of Onset  . Heart disease Father 42  . Thyroid disease Mother   . Diabetes Mother        developed in her 69's  . Dementia Mother        mini strokes  . Heart disease Maternal Grandmother   . Prostate cancer Maternal Grandfather   . Heart disease Paternal Grandmother   . Heart disease Paternal Grandfather   . Colon cancer Neg Hx   . Esophageal cancer Neg Hx   . Stomach cancer Neg Hx   . Pancreatic cancer Neg Hx      Social History   Tobacco Use  . Smoking status: Never Smoker  . Smokeless tobacco: Never Used  Substance Use Topics  . Alcohol use: Yes    Alcohol/week: 0.0 standard drinks    Comment: occassional of wine  . Drug use: No    Home Medications Prior to Admission medications   Medication Sig Start Date End Date Taking? Authorizing Provider  acetaminophen (TYLENOL) 500 MG tablet Take 500 mg by mouth every 4 (four) hours as needed for mild pain.    Yes [provider]  cholecalciferol (VITAMIN D) 1000 units tablet Take 2,000 Units by mouth daily.   Yes [provider]    Allergies    Patient has no known allergies.  Review of Systems   Review of Systems  Constitutional: Positive for fatigue. Negative for fever.  HENT: Negative for sore throat.   Eyes: Negative for visual disturbance.  Respiratory: Negative for cough and shortness of breath.   Cardiovascular: Negative for chest pain.  Gastrointestinal: Positive for abdominal pain, anal bleeding, blood in stool, diarrhea and nausea. Negative for vomiting.  Genitourinary: Negative for difficulty urinating.  Musculoskeletal: Negative for back pain.  Skin: Negative for rash.  Neurological: Positive for light-headedness. Negative for syncope and headaches.    Physical Exam Updated Vital Signs BP 119/67 (BP Location: Left Arm)   Pulse 76   Temp 98.3 F (36.8 C) (Oral)   Resp 16   Ht 5\' 2"  (1.575 m)   Wt 61.2 kg   SpO2 98%   BMI 24.69 kg/m   Physical Exam Vitals and nursing note reviewed.  Constitutional:      General: She is not in acute distress.    Appearance: Normal appearance. She is not ill-appearing, toxic-appearing or diaphoretic.  HENT:     Head: Normocephalic.  Eyes:     Conjunctiva/sclera: Conjunctivae normal.  Cardiovascular:     Rate and Rhythm: Normal rate and regular rhythm.     Pulses: Normal pulses.  Pulmonary:     Effort: Pulmonary effort is normal. No respiratory distress.   Abdominal:     Tenderness: There is abdominal tenderness ("discomfort" left side of abdomen and LLQ).  Genitourinary:    Comments: Scant gross blood on finger tip, no stool No external hemorrhoids, no rectal pain Musculoskeletal:        General: No deformity or signs of injury.     Cervical back: No rigidity.  Skin:    General: Skin is warm and dry.     Coloration: Skin is not jaundiced or pale.  Neurological:     General: No focal deficit present.     Mental Status: She is alert and oriented  to person, place, and time.     ED Results / Procedures / Treatments   Labs (all labs ordered are listed, but only abnormal results are displayed) Labs Reviewed  OCCULT BLOOD X 1 CARD TO LAB, STOOL - Abnormal; Notable for the following components:      Result Value   Fecal Occult Bld POSITIVE (*)    All other components within normal limits  CBC WITH DIFFERENTIAL/PLATELET - Abnormal; Notable for the following components:   WBC 11.2 (*)    Neutro Abs 9.7 (*)    All other components within normal limits  COMPREHENSIVE METABOLIC PANEL - Abnormal; Notable for the following components:   Glucose, Bld 136 (*)    BUN 28 (*)    Creatinine, Ser 1.15 (*)    Calcium 8.8 (*)    GFR calc non Af Amer 47 (*)    GFR calc Af Amer 55 (*)    All other components within normal limits  COMPREHENSIVE METABOLIC PANEL - Abnormal; Notable for the following components:   Glucose, Bld 129 (*)    Creatinine, Ser 1.09 (*)    Calcium 8.4 (*)    Total Bilirubin 1.3 (*)    GFR calc non Af Amer 50 (*)    GFR calc Af Amer 58 (*)    All other components within normal limits  CBC - Abnormal; Notable for the following components:   WBC 13.9 (*)    All other components within normal limits  CBC - Abnormal; Notable for the following components:   WBC 13.8 (*)    RBC 3.78 (*)    Hemoglobin 11.5 (*)    HCT 34.5 (*)    All other components within normal limits  SARS CORONAVIRUS 2 BY RT PCR (HOSPITAL ORDER,  [facility])  C DIFFICILE QUICK SCREEN W PCR REFLEX  LIPASE, BLOOD  CBC  TYPE AND SCREEN  ABO/RH    EKG None  Radiology CT ABDOMEN PELVIS W CONTRAST  Result Date: 12/09/2019 CLINICAL DATA:  Diffuse abdominal pain. Bright red rectal bleeding. EXAM: CT ABDOMEN AND PELVIS WITH CONTRAST TECHNIQUE: Multidetector CT imaging of the abdomen and pelvis was performed using the standard protocol following bolus administration of intravenous contrast. CONTRAST:  132mL OMNIPAQUE IOHEXOL 300 MG/ML  SOLN COMPARISON:  01/04/2018 FINDINGS: Lower chest: Lung bases are clear. Hepatobiliary: Focal fatty infiltration adjacent to the falciform ligament. No suspicious hepatic lesion. Gallbladder physiologically distended, no calcified stone. No biliary dilatation. Pancreas: No ductal dilatation or inflammation. Spleen: Normal in size without focal abnormality. Adrenals/Urinary Tract: Normal adrenal glands. No hydronephrosis or perinephric edema. Homogeneous renal enhancement with symmetric excretion on delayed phase imaging. There is asymmetric left renal atrophy. Urinary bladder nondistended not well evaluated. Stomach/Bowel: Diffuse colonic wall thickening and pericolonic edema extending from the hepatic flexure through the mid descending colon consistent with colitis. Scattered distal colonic diverticula without focal diverticulitis. Chain sutures in the sigmoid. Prior appendectomy. Stomach and small bowel are unremarkable. Vascular/Lymphatic: Aortic and branch atherosclerosis. Portal vein is patent. Circumaortic left renal vein, lower left renal vein drains to the left iliac vein. Mesenteric vessels are patent. No adenopathy. Reproductive: Uterine calcification likely fibroid. 15 mm right adnexal cyst, unchanged/diminished from prior. Other: No ascites or free air. No intra-abdominal abscess. Musculoskeletal: There are no acute or suspicious osseous abnormalities. IMPRESSION: 1. Colitis  extending from the hepatic flexure through the mid descending colon, likely infectious or inflammatory. 2. Distal colonic diverticulosis without focal diverticulitis. Aortic Atherosclerosis (ICD10-I70.0). Electronically  Signed   By: Keith Rake M.D.   On: 12/09/2019 18:09    Procedures Procedures (including critical care time)  Medications Ordered in ED Medications  ondansetron (ZOFRAN) tablet 4 mg (has no administration in time range)    Or  ondansetron (ZOFRAN) injection 4 mg (has no administration in time range)  metroNIDAZOLE (FLAGYL) IVPB 500 mg (500 mg Intravenous New Bag/Given 12/10/19 0324)  ciprofloxacin (CIPRO) IVPB 400 mg (400 mg Intravenous New Bag/Given 12/10/19 0451)  0.9 %  sodium chloride infusion (has no administration in time range)  iohexol (OMNIPAQUE) 300 MG/ML solution 100 mL (100 mLs Intravenous Contrast Given 12/09/19 1725)  morphine 4 MG/ML injection 4 mg (4 mg Intravenous Given 12/09/19 2101)  sodium chloride 0.9 % bolus 1,000 mL ( Intravenous Stopped 12/09/19 2207)  ondansetron (ZOFRAN) injection 4 mg (4 mg Intravenous Given 12/09/19 2100)    ED Course  I have reviewed the triage vital signs and the nursing notes.  Pertinent labs & imaging results that were available during my care of the patient were reviewed by me and considered in my medical decision making (see chart for details).    MDM Rules/Calculators/A&P                     74 year old female with a history of diverticulitis with history of perforation and partial colectomy in 2010, hyperlipidemia, SBO, presents with concern for abdominal cramping and rectal bleeding.  Labs show normal hgb. VS stable.  CT shows colitis without diverticulitis.     While in the ED, developed several more episodes of passing blood clots per rectum.  Clinically overall suspect colitis as etiology of rectal bleeding, however given description of blood passed and diverticula will admit for continued observation for rectal  bleeding.    Final Clinical Impression(s) / ED Diagnoses Final diagnoses:  Rectal bleeding  Colitis    Rx / DC Orders ED Discharge Orders    None       Gareth Morgan, MD 12/10/19 0930

## 2019-12-09 NOTE — Telephone Encounter (Signed)
Thank you, frank rectal bleeding has to be evaluated in there ER

## 2019-12-10 ENCOUNTER — Encounter (HOSPITAL_COMMUNITY): Payer: Self-pay | Admitting: Internal Medicine

## 2019-12-10 DIAGNOSIS — J309 Allergic rhinitis, unspecified: Secondary | ICD-10-CM | POA: Diagnosis not present

## 2019-12-10 DIAGNOSIS — N182 Chronic kidney disease, stage 2 (mild): Secondary | ICD-10-CM | POA: Diagnosis not present

## 2019-12-10 DIAGNOSIS — K529 Noninfective gastroenteritis and colitis, unspecified: Principal | ICD-10-CM

## 2019-12-10 DIAGNOSIS — Z96653 Presence of artificial knee joint, bilateral: Secondary | ICD-10-CM | POA: Diagnosis present

## 2019-12-10 DIAGNOSIS — Z9842 Cataract extraction status, left eye: Secondary | ICD-10-CM | POA: Diagnosis not present

## 2019-12-10 DIAGNOSIS — Z90721 Acquired absence of ovaries, unilateral: Secondary | ICD-10-CM | POA: Diagnosis not present

## 2019-12-10 DIAGNOSIS — Z823 Family history of stroke: Secondary | ICD-10-CM | POA: Diagnosis not present

## 2019-12-10 DIAGNOSIS — E785 Hyperlipidemia, unspecified: Secondary | ICD-10-CM | POA: Diagnosis not present

## 2019-12-10 DIAGNOSIS — Z8249 Family history of ischemic heart disease and other diseases of the circulatory system: Secondary | ICD-10-CM | POA: Diagnosis not present

## 2019-12-10 DIAGNOSIS — Z833 Family history of diabetes mellitus: Secondary | ICD-10-CM | POA: Diagnosis not present

## 2019-12-10 DIAGNOSIS — B370 Candidal stomatitis: Secondary | ICD-10-CM | POA: Diagnosis not present

## 2019-12-10 DIAGNOSIS — Z8349 Family history of other endocrine, nutritional and metabolic diseases: Secondary | ICD-10-CM | POA: Diagnosis not present

## 2019-12-10 DIAGNOSIS — K625 Hemorrhage of anus and rectum: Secondary | ICD-10-CM | POA: Diagnosis present

## 2019-12-10 DIAGNOSIS — Z20822 Contact with and (suspected) exposure to covid-19: Secondary | ICD-10-CM | POA: Diagnosis not present

## 2019-12-10 LAB — CBC
HCT: 36.4 % (ref 36.0–46.0)
HCT: 34.5 % — ABNORMAL LOW (ref 36.0–46.0)
HCT: 36.6 % (ref 36.0–46.0)
Hemoglobin: 12.3 g/dL (ref 12.0–15.0)
Hemoglobin: 11.5 g/dL — ABNORMAL LOW (ref 12.0–15.0)
Hemoglobin: 12.2 g/dL (ref 12.0–15.0)
MCH: 30.7 pg (ref 26.0–34.0)
MCH: 30.4 pg (ref 26.0–34.0)
MCH: 30.3 pg (ref 26.0–34.0)
MCHC: 33.8 g/dL (ref 30.0–36.0)
MCHC: 33.3 g/dL (ref 30.0–36.0)
MCHC: 33.3 g/dL (ref 30.0–36.0)
MCV: 90.8 fL (ref 80.0–100.0)
MCV: 91.3 fL (ref 80.0–100.0)
MCV: 91 fL (ref 80.0–100.0)
Platelets: 204 10*3/uL (ref 150–400)
Platelets: 214 10*3/uL (ref 150–400)
Platelets: 229 10*3/uL (ref 150–400)
RBC: 4.01 MIL/uL (ref 3.87–5.11)
RBC: 3.78 MIL/uL — ABNORMAL LOW (ref 3.87–5.11)
RBC: 4.02 MIL/uL (ref 3.87–5.11)
RDW: 13 % (ref 11.5–15.5)
RDW: 13.2 % (ref 11.5–15.5)
RDW: 13.2 % (ref 11.5–15.5)
WBC: 13.9 10*3/uL — ABNORMAL HIGH (ref 4.0–10.5)
WBC: 13.8 10*3/uL — ABNORMAL HIGH (ref 4.0–10.5)
WBC: 15.7 10*3/uL — ABNORMAL HIGH (ref 4.0–10.5)
nRBC: 0 % (ref 0.0–0.2)
nRBC: 0 % (ref 0.0–0.2)
nRBC: 0 % (ref 0.0–0.2)

## 2019-12-10 LAB — COMPREHENSIVE METABOLIC PANEL
ALT: 21 U/L (ref 0–44)
AST: 22 U/L (ref 15–41)
Albumin: 3.5 g/dL (ref 3.5–5.0)
Alkaline Phosphatase: 56 U/L (ref 38–126)
Anion gap: 8 (ref 5–15)
BUN: 22 mg/dL (ref 8–23)
CO2: 23 mmol/L (ref 22–32)
Calcium: 8.4 mg/dL — ABNORMAL LOW (ref 8.9–10.3)
Chloride: 107 mmol/L (ref 98–111)
Creatinine, Ser: 1.09 mg/dL — ABNORMAL HIGH (ref 0.44–1.00)
GFR calc Af Amer: 58 mL/min — ABNORMAL LOW (ref >60)
GFR calc non Af Amer: 50 mL/min — ABNORMAL LOW (ref >60)
Glucose, Bld: 129 mg/dL — ABNORMAL HIGH (ref 70–99)
Potassium: 4.2 mmol/L (ref 3.5–5.1)
Sodium: 138 mmol/L (ref 135–145)
Total Bilirubin: 1.3 mg/dL — ABNORMAL HIGH (ref 0.3–1.2)
Total Protein: 6.6 g/dL (ref 6.5–8.1)

## 2019-12-10 LAB — C DIFFICILE QUICK SCREEN W PCR REFLEX
C Diff antigen: NEGATIVE
C Diff interpretation: NOT DETECTED
C Diff toxin: NEGATIVE

## 2019-12-10 LAB — TYPE AND SCREEN
ABO/RH(D): B NEG
Antibody Screen: NEGATIVE

## 2019-12-10 LAB — ABO/RH: ABO/RH(D): B NEG

## 2019-12-10 MED ORDER — ONDANSETRON HCL 4 MG/2ML IJ SOLN: 4.0000 mg | Freq: Four times a day (QID) | INTRAMUSCULAR | Status: DC | PRN

## 2019-12-10 MED ORDER — CIPROFLOXACIN IN D5W 400 MG/200ML IV SOLN
400.0000 mg | Freq: Two times a day (BID) | INTRAVENOUS | Status: DC
  Administered 2019-12-10 – 2019-12-12 (×5): 400 mg via INTRAVENOUS
  Filled 2019-12-10 (×5): qty 200

## 2019-12-10 MED ORDER — METRONIDAZOLE IN NACL 5-0.79 MG/ML-% IV SOLN
500.0000 mg | Freq: Three times a day (TID) | INTRAVENOUS | Status: DC
  Administered 2019-12-10 – 2019-12-12 (×7): 500 mg via INTRAVENOUS
  Filled 2019-12-10 (×7): qty 100

## 2019-12-10 MED ORDER — ONDANSETRON HCL 4 MG PO TABS: 4.0000 mg | ORAL_TABLET | Freq: Four times a day (QID) | ORAL | Status: DC | PRN

## 2019-12-10 MED ORDER — SODIUM CHLORIDE 0.9 % IV SOLN: INTRAVENOUS | Status: DC

## 2019-12-10 MED ORDER — SODIUM CHLORIDE 0.9 % IV SOLN: INTRAVENOUS | Status: AC

## 2019-12-10 NOTE — Progress Notes (Signed)
Pharmacy Antibiotic Note  Catherine Prince is a 74 y.o. female admitted on 12/09/2019 with rectal bleeding, intra abdominal infection.Marland Kitchen  Pharmacy has been consulted for cipro dosing.  Plan:  Cipro 400mg  IV q12h  No further dose adjustments needed, Rx will sign-off  Height: 5\' 2"  (157.5 cm) Weight: 61.2 kg (135 lb) IBW/kg (Calculated) : 50.1  Temp (24hrs), Avg:98.8 F (37.1 C), Min:98.7 F (37.1 C), Max:98.8 F (37.1 C)  Recent Labs  Lab 12/09/19 1507  WBC 11.2*  CREATININE 1.15*    Estimated Creatinine Clearance: 37.5 mL/min (A) (by C-G formula based on SCr of 1.15 mg/dL (H)).    No Known Allergies  Antimicrobials this admission: 6/10 Cipro >> 6/10 Flagyl >>  Dose adjustments this admission: none  Microbiology results: none  Thank you for allowing pharmacy to be a part of this patient's care.  Peggyann Juba, PharmD, BCPS Pharmacy: [phone] 12/10/2019 2:35 AM

## 2019-12-10 NOTE — H&P (Signed)
History and Physical    Shelie Lansing DEY:[id number] DOB: [birth year] DOA: 12/09/2019  PCP: [name], MD  Patient coming from: Home.  Chief Complaint: Abdominal pain rectal bleeding.  HPI: Catherine Prince is a 74 y.o. female with no significant past medical history presents to the ER at [facility] with complaints of multiple episodes of rectal bleeding.  Patient stated symptoms started yesterday morning when patient had a bout of diarrhea following which patient had multiple episodes of bloody diarrhea.  After the first episode of diarrhea patient started experiencing epigastric and left upper quadrant abdominal discomfort which was constant and also had initially some subjective feeling of fever chills.  Patient denies any recent travel.  Has had some lasagna following which her husband also had some loose stools.  ED Course: In the ER patient was hemodynamically stable Covid test was negative lab work show WBC of 11.2 hemoglobin 13.2 platelets 233 creatinine 1.1.  Patient had CAT scan abdomen pelvis which shows colitis involving the hepatic flexure through mid part of the descending colon.  Patient admitted for further management.  Review of Systems: As per HPI, rest all negative.   Past Medical History:  Diagnosis Date  . Allergy    mild seasonal allergies  . Arthritis   . Cancer ([place])   . Cataract    Left eye  . Diverticulitis   . Diverticulosis of colon without hemorrhage 12/09/2014  . H/O diverticulitis of colon 12/09/2014   7.5 inches removed after rupture in 2010, has done well since Also rook appendix and 1 ovary  . History of chicken pox   . Hyperlipidemia   . Impaired fasting glucose 12/09/2014  . Macular hole of left eye   . Measles   . Mumps   . Nocturia 12/09/2014  . Osteoarthritis 12/09/2014  . Osteopenia 04/01/2015  . Personal history of cardiac arrhythmia 03/30/2016  . Vitamin D deficiency     Past Surgical History:  Procedure Laterality Date  .  APPENDECTOMY    . BLEPHAROPLASTY Bilateral 08/02/2017  . BREAST CYST ASPIRATION    . CATARACT EXTRACTION Left   . COLON SURGERY  2010   pt. reports intestines burst  . EYE SURGERY     Left eye, retinal hole, Dr Manson Allan, at [facility] in [place] Left    09/2013  . REPLACEMENT TOTAL KNEE Right 2016  . RIGHT OOPHORECTOMY Right    during Diverticular surgery     reports that she has never smoked. She has never used smokeless tobacco. She reports current alcohol use. She reports that she does not use drugs.  No Known Allergies  Family History  Problem Relation Age of Onset  . Heart disease Father 63  . Thyroid disease Mother   . Diabetes Mother        developed in her 26's  . Dementia Mother        mini strokes  . Heart disease Maternal Grandmother   . Prostate cancer Maternal Grandfather   . Heart disease Paternal Grandmother   . Heart disease Paternal Grandfather   . Colon cancer Neg Hx   . Esophageal cancer Neg Hx   . Stomach cancer Neg Hx   . Pancreatic cancer Neg Hx     Prior to Admission medications   Medication Sig Start Date End Date Taking? Authorizing Provider  acetaminophen (TYLENOL) 500 MG tablet Take 500 mg by mouth every 4 (four) hours as needed for mild pain.  Yes [provider]  cholecalciferol (VITAMIN D) 1000 units tablet Take 2,000 Units by mouth daily.   Yes [provider]    Physical Exam: Constitutional: Moderately built and nourished. Vitals:   12/09/19 2130 12/09/19 2200 12/09/19 2300 12/10/19 0000  BP: (!) 141/75 (!) 143/79 (!) 146/71 132/70  Pulse: 67 71 71 83  Resp:    16  Temp:    98.8 F (37.1 C)  TempSrc:    Oral  SpO2: 100% 94% 95% 100%  Weight:      Height:       Eyes: Anicteric no pallor. ENMT: No discharge from the ears eyes nose or mouth. Neck: No mass felt.  No neck rigidity. Respiratory: No rhonchi or crepitations. Cardiovascular: S1-S2 heard. Abdomen: Soft nontender bowel  sounds present. Musculoskeletal: No edema. Skin: No rash. Neurologic: Alert awake oriented to time place and person.  Moves all extremities. Psychiatric: Appears normal with normal affect.   Labs on Admission: I have personally reviewed following labs and imaging studies  CBC: Recent Labs  Lab 12/09/19 1507  WBC 11.2*  NEUTROABS 9.7*  HGB 13.2  HCT 39.1  MCV 89.9  PLT 867   Basic Metabolic Panel: Recent Labs  Lab 12/09/19 1507  NA 135  K 4.6  CL 103  CO2 24  GLUCOSE 136*  BUN 28*  CREATININE 1.15*  CALCIUM 8.8*   GFR: Estimated Creatinine Clearance: 37.5 mL/min (A) (by C-G formula based on SCr of 1.15 mg/dL (H)). Liver Function Tests: Recent Labs  Lab 12/09/19 1507  AST 27  ALT 22  ALKPHOS 59  BILITOT 0.7  PROT 7.3  ALBUMIN 4.0   Recent Labs  Lab 12/09/19 1507  LIPASE 39   No results for input(s): AMMONIA in the last 168 hours. Coagulation Profile: No results for input(s): INR, PROTIME in the last 168 hours. Cardiac Enzymes: No results for input(s): CKTOTAL, CKMB, CKMBINDEX, TROPONINI in the last 168 hours. BNP (last 3 results) No results for input(s): PROBNP in the last 8760 hours. HbA1C: No results for input(s): HGBA1C in the last 72 hours. CBG: No results for input(s): GLUCAP in the last 168 hours. Lipid Profile: No results for input(s): CHOL, HDL, LDLCALC, TRIG, CHOLHDL, LDLDIRECT in the last 72 hours. Thyroid Function Tests: No results for input(s): TSH, T4TOTAL, FREET4, T3FREE, THYROIDAB in the last 72 hours. Anemia Panel: No results for input(s): VITAMINB12, FOLATE, FERRITIN, TIBC, IRON, RETICCTPCT in the last 72 hours. Urine analysis:    Component Value Date/Time   COLORURINE STRAW (A) 01/04/2018 1037   APPEARANCEUR CLEAR 01/04/2018 1037   LABSPEC <1.005 (L) 01/04/2018 1037   PHURINE 6.5 01/04/2018 1037   GLUCOSEU NEGATIVE 01/04/2018 1037   GLUCOSEU NEGATIVE 01/07/2015 0834   HGBUR TRACE (A) 01/04/2018 1037   BILIRUBINUR NEGATIVE  01/04/2018 1037   KETONESUR 15 (A) 01/04/2018 1037   PROTEINUR NEGATIVE 01/04/2018 1037   UROBILINOGEN 0.2 01/07/2015 0834   NITRITE NEGATIVE 01/04/2018 1037   LEUKOCYTESUR NEGATIVE 01/04/2018 1037   Sepsis Labs: @LABRCNTIP (procalcitonin:4,lacticidven:4) ) Recent Results (from the past 240 hour(s))  SARS Coronavirus 2 by RT PCR (hospital order, performed in [place] hospital lab) Nasopharyngeal Nasopharyngeal Swab     Status: None   Collection Time: 12/09/19 10:21 PM   Specimen: Nasopharyngeal Swab  Result Value Ref Range Status   SARS Coronavirus 2 NEGATIVE NEGATIVE Final    Comment: (NOTE) SARS-CoV-2 target nucleic acids are NOT DETECTED. The SARS-CoV-2 RNA is generally detectable in upper and lower respiratory specimens during  the acute phase of infection. The lowest concentration of SARS-CoV-2 viral copies this assay can detect is 250 copies / mL. A negative result does not preclude SARS-CoV-2 infection and should not be used as the sole basis for treatment or other patient management decisions.  A negative result may occur with improper specimen collection / handling, submission of specimen other than nasopharyngeal swab, presence of viral mutation(s) within the areas targeted by this assay, and inadequate number of viral copies (<250 copies / mL). A negative result must be combined with clinical observations, patient history, and epidemiological information. Fact Sheet for Patients:   StrictlyIdeas.no Fact Sheet for Healthcare Providers: BankingDealers.co.za This test is not yet approved or cleared  by the [place] FDA and has been authorized for detection and/or diagnosis of SARS-CoV-2 by FDA under an Emergency Use Authorization (EUA).  This EUA will remain in effect (meaning this test can be used) for the duration of the COVID-19 declaration under Section 564(b)(1) of the Act, 21 U.S.C. section 360bbb-3(b)(1), unless  the authorization is terminated or revoked sooner. Performed at [facility], [address] [place], [place] [address]      Radiological Exams on Admission: CT ABDOMEN PELVIS W CONTRAST  Result Date: 12/09/2019 CLINICAL DATA:  Diffuse abdominal pain. Bright red rectal bleeding. EXAM: CT ABDOMEN AND PELVIS WITH CONTRAST TECHNIQUE: Multidetector CT imaging of the abdomen and pelvis was performed using the standard protocol following bolus administration of intravenous contrast. CONTRAST:  146mL OMNIPAQUE IOHEXOL 300 MG/ML  SOLN COMPARISON:  01/04/2018 FINDINGS: Lower chest: Lung bases are clear. Hepatobiliary: Focal fatty infiltration adjacent to the falciform ligament. No suspicious hepatic lesion. Gallbladder physiologically distended, no calcified stone. No biliary dilatation. Pancreas: No ductal dilatation or inflammation. Spleen: Normal in size without focal abnormality. Adrenals/Urinary Tract: Normal adrenal glands. No hydronephrosis or perinephric edema. Homogeneous renal enhancement with symmetric excretion on delayed phase imaging. There is asymmetric left renal atrophy. Urinary bladder nondistended not well evaluated. Stomach/Bowel: Diffuse colonic wall thickening and pericolonic edema extending from the hepatic flexure through the mid descending colon consistent with colitis. Scattered distal colonic diverticula without focal diverticulitis. Chain sutures in the sigmoid. Prior appendectomy. Stomach and small bowel are unremarkable. Vascular/Lymphatic: Aortic and branch atherosclerosis. Portal vein is patent. Circumaortic left renal vein, lower left renal vein drains to the left iliac vein. Mesenteric vessels are patent. No adenopathy. Reproductive: Uterine calcification likely fibroid. 15 mm right adnexal cyst, unchanged/diminished from prior. Other: No ascites or free air. No intra-abdominal abscess. Musculoskeletal: There are no acute or suspicious osseous abnormalities. IMPRESSION:  1. Colitis extending from the hepatic flexure through the mid descending colon, likely infectious or inflammatory. 2. Distal colonic diverticulosis without focal diverticulitis. Aortic Atherosclerosis (ICD10-I70.0). Electronically Signed   By: Keith Rake M.D.   On: 12/09/2019 18:09     Assessment/Plan Principal Problem:   Colitis Active Problems:   Rectal bleeding    1. Colitis -cause unclear among the differentials include infectious but since patient has segmental involvement would have to make sure patient is not having ischemic.  Will hydrate and keep patient on Cipro and Flagyl check serial CBCs.  Type and screen consult GI in the morning.  Patient has had a colonoscopy 10 years ago in [place] as per the patient was unremarkable. 2. Chronic kidney disease stage II creatinine appears to be at baseline. 3. History of prediabetes follow metabolic panel.   DVT prophylaxis: SCDs.  Avoiding pharmacological DVT prophylaxis since patient has rectal bleeding. Code  Status: Full code. Family Communication: Discussed with patient. Disposition Plan: Home. Consults called: None. Admission status: Observation.   Rise Patience MD [facility] Pager [phone].  If 7PM-7AM, please contact night-coverage [email or web address] Password [email or web address]  12/10/2019, 2:30 AM

## 2019-12-10 NOTE — Progress Notes (Signed)
PROGRESS NOTE    Catherine Prince  VOJ:[id number] DOB: [birth year] DOA: 12/09/2019 PCP: [name], MD   Brief Narrative:  74 year old with history of perforated diverticulitis requiring partial sigmoid colectomy in 2010 transferred here from [place] for multiple episodes of rectal bleeding.  In the ED CT scan showed colitis involving hepatic flexure through mid part of descending colon.   Assessment & Plan:   Principal Problem:   Colitis Active Problems:   Rectal bleeding  Rectal bleeding secondary to colitis History of partial sigmoid colectomy in 2010 for perforated diverticulitis -Infectious versus inflammatory versus ischemic -Monitor hemodynamically -If having diarrhea, will order stool studies to rule out C. Difficile -Cipro Flagyl - D2 -Colonoscopy 2018= two 1 mm polyps removed with cold biopsy, diverticulosis, end-to-end colocolonic anastomosis  Chronic kidney disease stage II -Renal function at baseline around 1.0.  DVT prophylaxis: SCDs Code Status: Full code Family Communication:    Status is: Observation  The patient remains OBS appropriate and will d/c before 2 midnights.  Dispo: The patient is from: Home              Anticipated d/c is to: Home              Anticipated d/c date is: 1 day              Patient currently is not medically stable to d/c. Still having diarrhea, nausea and abd pain. Cont Iv Abx  Subjective: Still having abd pain- mostly left sided with nausea. Had about 7 water BM in last 24 hrs. Some residual dark blood.  Review of Systems Otherwise negative except as per HPI, including: General: Denies fever, chills, night sweats or unintended weight loss. Resp: Denies cough, wheezing, shortness of breath. Cardiac: Denies chest pain, palpitations, orthopnea, paroxysmal nocturnal dyspnea. GI: Deniesvomiting, diarrhea or constipation GU: Denies dysuria, frequency, hesitancy or incontinence MS: Denies muscle aches, joint pain or  swelling Neuro: Denies headache, neurologic deficits (focal weakness, numbness, tingling), abnormal gait Psych: Denies anxiety, depression, SI/HI/AVH Skin: Denies new rashes or lesions ID: Denies sick contacts, exotic exposures, travel  Examination:  General exam: Appears calm and comfortable  Respiratory system: Clear to auscultation. Respiratory effort normal. Cardiovascular system: S1 & S2 heard, RRR. No JVD, murmurs, rubs, gallops or clicks. No pedal edema. Gastrointestinal system: Abdomen is nondistended, soft and nontender. No organomegaly or masses felt. Normal bowel sounds heard. Central nervous system: Alert and oriented. No focal neurological deficits. Extremities: Symmetric 5 x 5 power. Skin: No rashes, lesions or ulcers Psychiatry: Judgement and insight appear normal. Mood & affect appropriate.     Objective: Vitals:   12/10/19 0000 12/10/19 0054 12/10/19 0200 12/10/19 0606  BP: 132/70 137/74 129/67 119/67  Pulse: 83 72 70 76  Resp: 16 14 15 16   Temp: 98.8 F (37.1 C) 97.9 F (36.6 C) 98.4 F (36.9 C) 98.3 F (36.8 C)  TempSrc: Oral Oral Oral Oral  SpO2: 100% 96% 97% 98%  Weight:      Height:        Intake/Output Summary (Last 24 hours) at 12/10/2019 0800 Last data filed at 12/10/2019 0451 Gross per 24 hour  Intake 1001.31 ml  Output 500 ml  Net 501.31 ml   Filed Weights   12/09/19 1449  Weight: 61.2 kg     Data Reviewed:   CBC: Recent Labs  Lab 12/09/19 1507 12/10/19 0237 12/10/19 0639  WBC 11.2* 13.9* 13.8*  NEUTROABS 9.7*  --   --  HGB 13.2 12.3 11.5*  HCT 39.1 36.4 34.5*  MCV 89.9 90.8 91.3  PLT 253 204 098   Basic Metabolic Panel: Recent Labs  Lab 12/09/19 1507 12/10/19 0237  NA 135 138  K 4.6 4.2  CL 103 107  CO2 24 23  GLUCOSE 136* 129*  BUN 28* 22  CREATININE 1.15* 1.09*  CALCIUM 8.8* 8.4*   GFR: Estimated Creatinine Clearance: 39.5 mL/min (A) (by C-G formula based on SCr of 1.09 mg/dL (H)). Liver Function  Tests: Recent Labs  Lab 12/09/19 1507 12/10/19 0237  AST 27 22  ALT 22 21  ALKPHOS 59 56  BILITOT 0.7 1.3*  PROT 7.3 6.6  ALBUMIN 4.0 3.5   Recent Labs  Lab 12/09/19 1507  LIPASE 39   No results for input(s): AMMONIA in the last 168 hours. Coagulation Profile: No results for input(s): INR, PROTIME in the last 168 hours. Cardiac Enzymes: No results for input(s): CKTOTAL, CKMB, CKMBINDEX, TROPONINI in the last 168 hours. BNP (last 3 results) No results for input(s): PROBNP in the last 8760 hours. HbA1C: No results for input(s): HGBA1C in the last 72 hours. CBG: No results for input(s): GLUCAP in the last 168 hours. Lipid Profile: No results for input(s): CHOL, HDL, LDLCALC, TRIG, CHOLHDL, LDLDIRECT in the last 72 hours. Thyroid Function Tests: No results for input(s): TSH, T4TOTAL, FREET4, T3FREE, THYROIDAB in the last 72 hours. Anemia Panel: No results for input(s): VITAMINB12, FOLATE, FERRITIN, TIBC, IRON, RETICCTPCT in the last 72 hours. Sepsis Labs: No results for input(s): PROCALCITON, LATICACIDVEN in the last 168 hours.  Recent Results (from the past 240 hour(s))  SARS Coronavirus 2 by RT PCR (hospital order, performed in [facility] hospital lab) Nasopharyngeal Nasopharyngeal Swab     Status: None   Collection Time: 12/09/19 10:21 PM   Specimen: Nasopharyngeal Swab  Result Value Ref Range Status   SARS Coronavirus 2 NEGATIVE NEGATIVE Final    Comment: (NOTE) SARS-CoV-2 target nucleic acids are NOT DETECTED. The SARS-CoV-2 RNA is generally detectable in upper and lower respiratory specimens during the acute phase of infection. The lowest concentration of SARS-CoV-2 viral copies this assay can detect is 250 copies / mL. A negative result does not preclude SARS-CoV-2 infection and should not be used as the sole basis for treatment or other patient management decisions.  A negative result may occur with improper specimen collection / handling, submission of  specimen other than nasopharyngeal swab, presence of viral mutation(s) within the areas targeted by this assay, and inadequate number of viral copies (<250 copies / mL). A negative result must be combined with clinical observations, patient history, and epidemiological information. Fact Sheet for Patients:   StrictlyIdeas.no Fact Sheet for Healthcare Providers: BankingDealers.co.za This test is not yet approved or cleared  by the [place] FDA and has been authorized for detection and/or diagnosis of SARS-CoV-2 by FDA under an Emergency Use Authorization (EUA).  This EUA will remain in effect (meaning this test can be used) for the duration of the COVID-19 declaration under Section 564(b)(1) of the Act, 21 U.S.C. section 360bbb-3(b)(1), unless the authorization is terminated or revoked sooner. Performed at [facility], [address], [place], [place] [address]          Radiology Studies: CT ABDOMEN PELVIS W CONTRAST  Result Date: 12/09/2019 CLINICAL DATA:  Diffuse abdominal pain. Bright red rectal bleeding. EXAM: CT ABDOMEN AND PELVIS WITH CONTRAST TECHNIQUE: Multidetector CT imaging of the abdomen and pelvis was performed using the  standard protocol following bolus administration of intravenous contrast. CONTRAST:  158mL OMNIPAQUE IOHEXOL 300 MG/ML  SOLN COMPARISON:  01/04/2018 FINDINGS: Lower chest: Lung bases are clear. Hepatobiliary: Focal fatty infiltration adjacent to the falciform ligament. No suspicious hepatic lesion. Gallbladder physiologically distended, no calcified stone. No biliary dilatation. Pancreas: No ductal dilatation or inflammation. Spleen: Normal in size without focal abnormality. Adrenals/Urinary Tract: Normal adrenal glands. No hydronephrosis or perinephric edema. Homogeneous renal enhancement with symmetric excretion on delayed phase imaging. There is asymmetric left renal atrophy. Urinary bladder  nondistended not well evaluated. Stomach/Bowel: Diffuse colonic wall thickening and pericolonic edema extending from the hepatic flexure through the mid descending colon consistent with colitis. Scattered distal colonic diverticula without focal diverticulitis. Chain sutures in the sigmoid. Prior appendectomy. Stomach and small bowel are unremarkable. Vascular/Lymphatic: Aortic and branch atherosclerosis. Portal vein is patent. Circumaortic left renal vein, lower left renal vein drains to the left iliac vein. Mesenteric vessels are patent. No adenopathy. Reproductive: Uterine calcification likely fibroid. 15 mm right adnexal cyst, unchanged/diminished from prior. Other: No ascites or free air. No intra-abdominal abscess. Musculoskeletal: There are no acute or suspicious osseous abnormalities. IMPRESSION: 1. Colitis extending from the hepatic flexure through the mid descending colon, likely infectious or inflammatory. 2. Distal colonic diverticulosis without focal diverticulitis. Aortic Atherosclerosis (ICD10-I70.0). Electronically Signed   By: Keith Rake M.D.   On: 12/09/2019 18:09        Scheduled Meds: Continuous Infusions: . sodium chloride 100 mL/hr at 12/10/19 0320  . ciprofloxacin 400 mg (12/10/19 0451)  . metronidazole 500 mg (12/10/19 0324)     LOS: 0 days   Time spent= 35 mins    Laketra Bowdish Arsenio Loader, MD [facility]  If 7PM-7AM, please contact night-coverage  12/10/2019, 8:00 AM

## 2019-12-11 LAB — CBC
HCT: 32.5 % — ABNORMAL LOW (ref 36.0–46.0)
Hemoglobin: 10.7 g/dL — ABNORMAL LOW (ref 12.0–15.0)
MCH: 30.8 pg (ref 26.0–34.0)
MCHC: 32.9 g/dL (ref 30.0–36.0)
MCV: 93.7 fL (ref 80.0–100.0)
Platelets: 187 10*3/uL (ref 150–400)
RBC: 3.47 MIL/uL — ABNORMAL LOW (ref 3.87–5.11)
RDW: 13.5 % (ref 11.5–15.5)
WBC: 14.2 10*3/uL — ABNORMAL HIGH (ref 4.0–10.5)
nRBC: 0 % (ref 0.0–0.2)

## 2019-12-11 LAB — COMPREHENSIVE METABOLIC PANEL
ALT: 15 U/L (ref 0–44)
AST: 16 U/L (ref 15–41)
Albumin: 3 g/dL — ABNORMAL LOW (ref 3.5–5.0)
Alkaline Phosphatase: 47 U/L (ref 38–126)
Anion gap: 9 (ref 5–15)
BUN: 10 mg/dL (ref 8–23)
CO2: 22 mmol/L (ref 22–32)
Calcium: 8 mg/dL — ABNORMAL LOW (ref 8.9–10.3)
Chloride: 106 mmol/L (ref 98–111)
Creatinine, Ser: 1.08 mg/dL — ABNORMAL HIGH (ref 0.44–1.00)
GFR calc Af Amer: 59 mL/min — ABNORMAL LOW (ref >60)
GFR calc non Af Amer: 51 mL/min — ABNORMAL LOW (ref >60)
Glucose, Bld: 174 mg/dL — ABNORMAL HIGH (ref 70–99)
Potassium: 3.6 mmol/L (ref 3.5–5.1)
Sodium: 137 mmol/L (ref 135–145)
Total Bilirubin: 0.8 mg/dL (ref 0.3–1.2)
Total Protein: 5.6 g/dL — ABNORMAL LOW (ref 6.5–8.1)

## 2019-12-11 LAB — MAGNESIUM: Magnesium: 1.8 mg/dL (ref 1.7–2.4)

## 2019-12-11 MED ORDER — POTASSIUM CHLORIDE 10 MEQ/100ML IV SOLN
10.0000 meq | INTRAVENOUS | Status: AC
  Administered 2019-12-11 (×4): 10 meq via INTRAVENOUS
  Filled 2019-12-11 (×4): qty 100

## 2019-12-11 MED ORDER — MAGNESIUM SULFATE 2 GM/50ML IV SOLN
2.0000 g | Freq: Once | INTRAVENOUS | Status: AC
  Administered 2019-12-11: 2 g via INTRAVENOUS
  Filled 2019-12-11: qty 50

## 2019-12-11 MED ORDER — LORATADINE 10 MG PO TABS
10.0000 mg | ORAL_TABLET | Freq: Every day | ORAL | Status: DC
  Administered 2019-12-11: 10 mg via ORAL
  Filled 2019-12-11: qty 1

## 2019-12-11 MED ORDER — NYSTATIN 100000 UNIT/ML MT SUSP
5.0000 mL | Freq: Four times a day (QID) | OROMUCOSAL | Status: DC
  Administered 2019-12-11 (×4): 500000 [IU] via ORAL
  Filled 2019-12-11 (×4): qty 5

## 2019-12-11 NOTE — Progress Notes (Signed)
PROGRESS NOTE    Catherine Prince  WUX:[id number] DOB: 17-May-1946 DOA: 12/09/2019 PCP: [name], MD   Brief Narrative:  74 year old with history of perforated diverticulitis requiring partial sigmoid colectomy in 2010 transferred here from [place] for multiple episodes of rectal bleeding.  In the ED CT scan showed colitis involving hepatic flexure through mid part of descending colon.   Assessment & Plan:   Principal Problem:   Colitis Active Problems:   Rectal bleeding   Acute colitis  Rectal bleeding secondary to colitis, slowly improving History of partial sigmoid colectomy in 2010 for perforated diverticulitis -Infectious versus inflammatory versus ischemic -Monitor hemodynamically -C. difficile-negative -Advance diet today. -Cipro Flagyl - D3 -Colonoscopy 2018= two 1 mm polyps removed with cold biopsy, diverticulosis, end-to-end colocolonic anastomosis  Oral thrush-nystatin  Allergic rhinitis-loratadine  Chronic kidney disease stage II -Renal function at baseline around 1.0.  DVT prophylaxis: SCDs Code Status: Full code Family Communication:    Status is: Inpatient   Dispo: The patient is from: Home              Anticipated d/c is to: Home              Anticipated d/c date is: 1 day              Patient currently is not medically stable to d/c.  Maintain hospital stay for 1 more day to ensure she tolerates oral diet.  Plan for full liquid diet today during the day and then advance to soft later on.  Monitor episodes of diarrhea.  Subjective: She had about 7-8 loose stools over last 24 hours.  Denies any nausea.  Minimal abdominal pain overall feeling better. She also feels like she is oral thrush.  Review of Systems Otherwise negative except as per HPI, including: General: Denies fever, chills, night sweats or unintended weight loss. Resp: Denies cough, wheezing, shortness of breath. Cardiac: Denies chest pain, palpitations, orthopnea,  paroxysmal nocturnal dyspnea. GI: Denies abdominal pain, nausea, vomiting, constipation GU: Denies dysuria, frequency, hesitancy or incontinence MS: Denies muscle aches, joint pain or swelling Neuro: Denies headache, neurologic deficits (focal weakness, numbness, tingling), abnormal gait Psych: Denies anxiety, depression, SI/HI/AVH Skin: Denies new rashes or lesions ID: Denies sick contacts, exotic exposures, travel  Examination:  Constitutional: Not in acute distress, mild oral thrush Respiratory: Clear to auscultation bilaterally Cardiovascular: Normal sinus rhythm, no rubs Abdomen: Nontender nondistended good bowel sounds Musculoskeletal: No edema noted Skin: No rashes seen Neurologic: CN 2-12 grossly intact.  And nonfocal Psychiatric: Normal judgment and insight. Alert and oriented x 3. Normal mood.  Objective: Vitals:   12/10/19 1405 12/10/19 1405 12/10/19 2112 12/11/19 0526  BP: 131/68 131/68 123/61 121/66  Pulse: 72 75 80 80  Resp: 15 15 20 18   Temp: 97.8 F (36.6 C) 97.8 F (36.6 C) 98.7 F (37.1 C) 98.5 F (36.9 C)  TempSrc: Oral Oral Oral Oral  SpO2:  100% 97% 94%  Weight:      Height:        Intake/Output Summary (Last 24 hours) at 12/11/2019 1042 Last data filed at 12/11/2019 0528 Gross per 24 hour  Intake 2054.06 ml  Output 1000 ml  Net 1054.06 ml   Filed Weights   12/09/19 1449  Weight: 61.2 kg     Data Reviewed:   CBC: Recent Labs  Lab 12/09/19 1507 12/10/19 0237 12/10/19 0639 12/10/19 1004 12/11/19 0459  WBC 11.2* 13.9* 13.8* 15.7* 14.2*  NEUTROABS 9.7*  --   --   --   --  HGB 13.2 12.3 11.5* 12.2 10.7*  HCT 39.1 36.4 34.5* 36.6 32.5*  MCV 89.9 90.8 91.3 91.0 93.7  PLT 253 204 214 229 557   Basic Metabolic Panel: Recent Labs  Lab 12/09/19 1507 12/10/19 0237 12/11/19 0459  NA 135 138 137  K 4.6 4.2 3.6  CL 103 107 106  CO2 24 23 22   GLUCOSE 136* 129* 174*  BUN 28* 22 10  CREATININE 1.15* 1.09* 1.08*  CALCIUM 8.8* 8.4* 8.0*    MG  --   --  1.8   GFR: Estimated Creatinine Clearance: 39.9 mL/min (A) (by C-G formula based on SCr of 1.08 mg/dL (H)). Liver Function Tests: Recent Labs  Lab 12/09/19 1507 12/10/19 0237 12/11/19 0459  AST 27 22 16   ALT 22 21 15   ALKPHOS 59 56 47  BILITOT 0.7 1.3* 0.8  PROT 7.3 6.6 5.6*  ALBUMIN 4.0 3.5 3.0*   Recent Labs  Lab 12/09/19 1507  LIPASE 39   No results for input(s): AMMONIA in the last 168 hours. Coagulation Profile: No results for input(s): INR, PROTIME in the last 168 hours. Cardiac Enzymes: No results for input(s): CKTOTAL, CKMB, CKMBINDEX, TROPONINI in the last 168 hours. BNP (last 3 results) No results for input(s): PROBNP in the last 8760 hours. HbA1C: No results for input(s): HGBA1C in the last 72 hours. CBG: No results for input(s): GLUCAP in the last 168 hours. Lipid Profile: No results for input(s): CHOL, HDL, LDLCALC, TRIG, CHOLHDL, LDLDIRECT in the last 72 hours. Thyroid Function Tests: No results for input(s): TSH, T4TOTAL, FREET4, T3FREE, THYROIDAB in the last 72 hours. Anemia Panel: No results for input(s): VITAMINB12, FOLATE, FERRITIN, TIBC, IRON, RETICCTPCT in the last 72 hours. Sepsis Labs: No results for input(s): PROCALCITON, LATICACIDVEN in the last 168 hours.  Recent Results (from the past 240 hour(s))  SARS Coronavirus 2 by RT PCR (hospital order, performed in [facility] hospital lab) Nasopharyngeal Nasopharyngeal Swab     Status: None   Collection Time: 12/09/19 10:21 PM   Specimen: Nasopharyngeal Swab  Result Value Ref Range Status   SARS Coronavirus 2 NEGATIVE NEGATIVE Final    Comment: (NOTE) SARS-CoV-2 target nucleic acids are NOT DETECTED. The SARS-CoV-2 RNA is generally detectable in upper and lower respiratory specimens during the acute phase of infection. The lowest concentration of SARS-CoV-2 viral copies this assay can detect is 250 copies / mL. A negative result does not preclude SARS-CoV-2 infection and should  not be used as the sole basis for treatment or other patient management decisions.  A negative result may occur with improper specimen collection / handling, submission of specimen other than nasopharyngeal swab, presence of viral mutation(s) within the areas targeted by this assay, and inadequate number of viral copies (<250 copies / mL). A negative result must be combined with clinical observations, patient history, and epidemiological information. Fact Sheet for Patients:   StrictlyIdeas.no Fact Sheet for Healthcare Providers: BankingDealers.co.za This test is not yet approved or cleared  by the [place] FDA and has been authorized for detection and/or diagnosis of SARS-CoV-2 by FDA under an Emergency Use Authorization (EUA).  This EUA will remain in effect (meaning this test can be used) for the duration of the COVID-19 declaration under Section 564(b)(1) of the Act, 21 U.S.C. section 360bbb-3(b)(1), unless the authorization is terminated or revoked sooner. Performed at [facility], [address] [place], [place] [address]   C Difficile Quick Screen w PCR reflex     Status:  None   Collection Time: 12/10/19  9:58 AM   Specimen: STOOL  Result Value Ref Range Status   C Diff antigen NEGATIVE NEGATIVE Final   C Diff toxin NEGATIVE NEGATIVE Final   C Diff interpretation No C. difficile detected.  Final    Comment: Performed at [facility], [place] [address] [place], [place] [address]         Radiology Studies: CT ABDOMEN PELVIS W CONTRAST  Result Date: 12/09/2019 CLINICAL DATA:  Diffuse abdominal pain. Bright red rectal bleeding. EXAM: CT ABDOMEN AND PELVIS WITH CONTRAST TECHNIQUE: Multidetector CT imaging of the abdomen and pelvis was performed using the standard protocol following bolus administration of intravenous contrast. CONTRAST:  140mL OMNIPAQUE IOHEXOL 300 MG/ML  SOLN COMPARISON:   01/04/2018 FINDINGS: Lower chest: Lung bases are clear. Hepatobiliary: Focal fatty infiltration adjacent to the falciform ligament. No suspicious hepatic lesion. Gallbladder physiologically distended, no calcified stone. No biliary dilatation. Pancreas: No ductal dilatation or inflammation. Spleen: Normal in size without focal abnormality. Adrenals/Urinary Tract: Normal adrenal glands. No hydronephrosis or perinephric edema. Homogeneous renal enhancement with symmetric excretion on delayed phase imaging. There is asymmetric left renal atrophy. Urinary bladder nondistended not well evaluated. Stomach/Bowel: Diffuse colonic wall thickening and pericolonic edema extending from the hepatic flexure through the mid descending colon consistent with colitis. Scattered distal colonic diverticula without focal diverticulitis. Chain sutures in the sigmoid. Prior appendectomy. Stomach and small bowel are unremarkable. Vascular/Lymphatic: Aortic and branch atherosclerosis. Portal vein is patent. Circumaortic left renal vein, lower left renal vein drains to the left iliac vein. Mesenteric vessels are patent. No adenopathy. Reproductive: Uterine calcification likely fibroid. 15 mm right adnexal cyst, unchanged/diminished from prior. Other: No ascites or free air. No intra-abdominal abscess. Musculoskeletal: There are no acute or suspicious osseous abnormalities. IMPRESSION: 1. Colitis extending from the hepatic flexure through the mid descending colon, likely infectious or inflammatory. 2. Distal colonic diverticulosis without focal diverticulitis. Aortic Atherosclerosis (ICD10-I70.0). Electronically Signed   By: Keith Rake M.D.   On: 12/09/2019 18:09        Scheduled Meds: . loratadine  10 mg Oral Daily  . nystatin  5 mL Oral QID   Continuous Infusions: . sodium chloride 75 mL/hr at 12/10/19 1851  . ciprofloxacin 400 mg (12/11/19 0405)  . magnesium sulfate bolus IVPB 2 g (12/11/19 1018)  . metronidazole 500  mg (12/11/19 0257)  . potassium chloride 10 mEq (12/11/19 1007)     LOS: 1 day   Time spent= 35 mins    Castin Donaghue Arsenio Loader, MD [facility]  If 7PM-7AM, please contact night-coverage  12/11/2019, 10:42 AM

## 2019-12-12 LAB — COMPREHENSIVE METABOLIC PANEL
ALT: 13 U/L (ref 0–44)
AST: 18 U/L (ref 15–41)
Albumin: 3 g/dL — ABNORMAL LOW (ref 3.5–5.0)
Alkaline Phosphatase: 46 U/L (ref 38–126)
Anion gap: 8 (ref 5–15)
BUN: 10 mg/dL (ref 8–23)
CO2: 22 mmol/L (ref 22–32)
Calcium: 8.2 mg/dL — ABNORMAL LOW (ref 8.9–10.3)
Chloride: 109 mmol/L (ref 98–111)
Creatinine, Ser: 1.04 mg/dL — ABNORMAL HIGH (ref 0.44–1.00)
GFR calc Af Amer: >60 mL/min (ref >60)
GFR calc non Af Amer: 53 mL/min — ABNORMAL LOW (ref >60)
Glucose, Bld: 122 mg/dL — ABNORMAL HIGH (ref 70–99)
Potassium: 4.2 mmol/L (ref 3.5–5.1)
Sodium: 139 mmol/L (ref 135–145)
Total Bilirubin: 0.6 mg/dL (ref 0.3–1.2)
Total Protein: 5.6 g/dL — ABNORMAL LOW (ref 6.5–8.1)

## 2019-12-12 LAB — CBC
HCT: 32.7 % — ABNORMAL LOW (ref 36.0–46.0)
Hemoglobin: 10.7 g/dL — ABNORMAL LOW (ref 12.0–15.0)
MCH: 30 pg (ref 26.0–34.0)
MCHC: 32.7 g/dL (ref 30.0–36.0)
MCV: 91.6 fL (ref 80.0–100.0)
Platelets: 204 10*3/uL (ref 150–400)
RBC: 3.57 MIL/uL — ABNORMAL LOW (ref 3.87–5.11)
RDW: 13.3 % (ref 11.5–15.5)
WBC: 11.4 10*3/uL — ABNORMAL HIGH (ref 4.0–10.5)
nRBC: 0 % (ref 0.0–0.2)

## 2019-12-12 LAB — MAGNESIUM: Magnesium: 2 mg/dL (ref 1.7–2.4)

## 2019-12-12 MED ORDER — SACCHAROMYCES BOULARDII 250 MG PO CAPS
250.0000 mg | ORAL_CAPSULE | Freq: Two times a day (BID) | ORAL | 0 refills | Status: DC
Start: 2019-12-12 — End: 2019-12-16

## 2019-12-12 MED ORDER — CIPROFLOXACIN HCL 500 MG PO TABS
500.0000 mg | ORAL_TABLET | Freq: Two times a day (BID) | ORAL | 0 refills | Status: AC
Start: 2019-12-12 — End: 2019-12-17

## 2019-12-12 MED ORDER — ONDANSETRON 4 MG PO TBDP: 4.0000 mg | ORAL_TABLET | Freq: Three times a day (TID) | ORAL | 0 refills | Status: DC | PRN

## 2019-12-12 MED ORDER — METRONIDAZOLE 500 MG PO TABS
500.0000 mg | ORAL_TABLET | Freq: Three times a day (TID) | ORAL | 0 refills | Status: AC
Start: 2019-12-12 — End: 2019-12-17

## 2019-12-12 NOTE — Discharge Instructions (Signed)
Colitis ° °Colitis is inflammation of the colon. Colitis may last a short time (be acute), or it may last a long time (become chronic). °What are the causes? °This condition may be caused by: °· Viruses. °· Bacteria. °· Reaction to medicine. °· Certain autoimmune diseases such as Crohn's disease or ulcerative colitis. °· Radiation treatment. °· Decreased blood flow to the bowel (ischemia). °What are the signs or symptoms? °Symptoms of this condition include: °· Watery diarrhea. °· Passing bloody or tarry stool. °· Pain. °· Fever. °· Vomiting. °· Tiredness (fatigue). °· Weight loss. °· Bloating. °· Abdominal pain. °· Having fewer bowel movements than usual. °· A strong and sudden urge to have a bowel movement. °· Feeling like the bowel is not empty after a bowel movement. °How is this diagnosed? °This condition is diagnosed with a stool test or a blood test. °You may also have other tests, such as: °· X-rays. °· CT scan. °· Colonoscopy. °· Endoscopy. °· Biopsy. °How is this treated? °Treatment for this condition depends on the cause. The condition may be treated by: °· Resting the bowel. This involves not eating or drinking for a period of time. °· Fluids that are given through an IV. °· Medicine for pain and diarrhea. °· Antibiotic medicines. °· Cortisone medicines. °· Surgery. °Follow these instructions at home: °Eating and drinking ° °· Follow instructions from your health care provider about eating or drinking restrictions. °· Drink enough fluid to keep your urine pale yellow. °· Work with a dietitian to determine which foods cause your condition to flare up. °· Avoid foods that cause flare-ups. °· Eat a well-balanced diet. °General instructions °· If you were prescribed an antibiotic medicine, take it as told by your health care provider. Do not stop taking the antibiotic even if you start to feel better. °· Take over-the-counter and prescription medicines only as told by your health care provider. °· Keep all  follow-up visits as told by your health care provider. This is important. °Contact a health care provider if: °· Your symptoms do not go away. °· You develop new symptoms. °Get help right away if you: °· Have a fever that does not go away with treatment. °· Develop chills. °· Have extreme weakness, fainting, or dehydration. °· Have repeated vomiting. °· Develop severe pain in your abdomen. °· Pass bloody or tarry stool. °Summary °· Colitis is inflammation of the colon. Colitis may last a short time (be acute), or it may last a long time (become chronic). °· Treatment for this condition depends on the cause and may include resting the bowel, taking medicines, or having surgery. °· If you were prescribed an antibiotic medicine, take it as told by your health care provider. Do not stop taking the antibiotic even if you start to feel better. °· Get help right away if you develop severe pain in your abdomen. °· Keep all follow-up visits as told by your health care provider. This is important. °This information is not intended to replace advice given to you by your health care provider. Make sure you discuss any questions you have with your health care provider. °Document Revised: 12/19/2017 Document Reviewed: 12/19/2017 °Elsevier Patient Education © 2020 [employer] ° °

## 2019-12-12 NOTE — Discharge Summary (Signed)
Physician Discharge Summary  Catherine Prince:[id number] DOB: 1946/01/18 DOA: 12/09/2019  PCP: Catherine Lukes, MD  Admit date: 12/09/2019 Discharge date: 12/12/2019  Admitted From:Home Disposition: Home  Recommendations for Outpatient Follow-up:  1. Follow up with PCP in 1-2 weeks 2. Please obtain BMP/CBC in one week your next doctors visit.  3. 5 days of oral Cipro and Flagyl followed by probiotic 4. Antiemetics as needed 5. Advised to follow-up outpatient gastroenterology in 6-8 weeks for need for colonoscopy.  Previously has a colonoscopy with Dr. Loletha Prince   Discharge Condition: Stable CODE STATUS: Full code Diet recommendation: GI soft, alternate with full liquid over next 5 to 7 days  Brief/Interim Summary: 74 year old with history of perforated diverticulitis requiring partial sigmoid colectomy in 2010 transferred here from [place] for multiple episodes of rectal bleeding.  In the ED CT scan showed colitis involving hepatic flexure through mid part of descending colon.  Symptoms significantly improved with conservative management.  Today she is tolerating oral diet without any issues stable for discharge.   Assessment & Plan:   Principal Problem:   Colitis Active Problems:   Rectal bleeding   Acute colitis  Rectal bleeding secondary to colitis, slowly improving History of partial sigmoid colectomy in 2010 for perforated diverticulitis -Tolerating oral without any issues -C. difficile-negative -Advance diet today. -Cipro Flagyl -5 more day prescription given.  Advised to take probiotics thereafter for 2 weeks -Colonoscopy 2018= two 1 mm polyps removed with cold biopsy, diverticulosis, end-to-end colocolonic anastomosis.  Follow-up outpatient GI for possible repeat colonoscopy in 6-8 weeks  Oral thrush-nystatin  Allergic rhinitis-loratadine  Chronic kidney disease stage II -Renal function at baseline around 1.0.    Discharge Diagnoses:  Principal  Problem:   Colitis Active Problems:   Rectal bleeding   Acute colitis    Consultations:  None  Subjective: Feels great no complaints.  May be 5 watery bowel movements overnight, no longer having nausea or vomiting.  Tolerating p.o. without any issues.  Discharge Exam: Vitals:   12/11/19 2058 12/12/19 0534  BP: 122/65 126/79  Pulse: 95 77  Resp: 18 18  Temp: 98.3 F (36.8 C) 98.1 F (36.7 C)  SpO2: 97% 96%   Vitals:   12/11/19 0526 12/11/19 1345 12/11/19 2058 12/12/19 0534  BP: 121/66 121/66 122/65 126/79  Pulse: 80 80 95 77  Resp: 18 19 18 18   Temp: 98.5 F (36.9 C) 98 F (36.7 C) 98.3 F (36.8 C) 98.1 F (36.7 C)  TempSrc: Oral Oral Oral Oral  SpO2: 94% 100% 97% 96%  Weight:      Height:        General: Pt is alert, awake, not in acute distress Cardiovascular: RRR, S1/S2 +, no rubs, no gallops Respiratory: CTA bilaterally, no wheezing, no rhonchi Abdominal: Soft, NT, ND, bowel sounds + Extremities: no edema, no cyanosis  Discharge Instructions   Allergies as of 12/12/2019   No Known Allergies     Medication List    TAKE these medications   acetaminophen 500 MG tablet Commonly known as: TYLENOL Take 500 mg by mouth every 4 (four) hours as needed for mild pain.   cholecalciferol 1000 units tablet Commonly known as: VITAMIN D Take 2,000 Units by mouth daily.   ciprofloxacin 500 MG tablet Commonly known as: Cipro Take 1 tablet (500 mg total) by mouth 2 (two) times daily for 5 days.   metroNIDAZOLE 500 MG tablet Commonly known as: Flagyl Take 1 tablet (500 mg total) by mouth  3 (three) times daily for 5 days.   ondansetron 4 MG disintegrating tablet Commonly known as: Zofran ODT Take 1 tablet (4 mg total) by mouth every 8 (eight) hours as needed for nausea or vomiting.   saccharomyces boulardii 250 MG capsule Commonly known as: Florastor Take 1 capsule (250 mg total) by mouth 2 (two) times daily for 14 days.       Follow-up Information     Catherine Lukes, MD. Schedule an appointment as soon as possible for a visit in 1 week(s).   Specialty: Family Medicine Contact information: [address] [place] [address] [phone]        Catherine Stabler, MD. Schedule an appointment as soon as possible for a visit in 6 week(s).   Specialty: Gastroenterology Why: C scope post Colitis episode.  Contact information: [address] [place] [address] [phone]              No Known Allergies  You were cared for by a hospitalist during your hospital stay. If you have any questions about your discharge medications or the care you received while you were in the hospital after you are discharged, you can call the unit and asked to speak with the hospitalist on call if the hospitalist that took care of you is not available. Once you are discharged, your primary care physician will handle any further medical issues. Please note that no refills for any discharge medications will be authorized once you are discharged, as it is imperative that you return to your primary care physician (or establish a relationship with a primary care physician if you do not have one) for your aftercare needs so that they can reassess your need for medications and monitor your lab values.   Procedures/Studies: CT ABDOMEN PELVIS W CONTRAST  Result Date: 12/09/2019 CLINICAL DATA:  Diffuse abdominal pain. Bright red rectal bleeding. EXAM: CT ABDOMEN AND PELVIS WITH CONTRAST TECHNIQUE: Multidetector CT imaging of the abdomen and pelvis was performed using the standard protocol following bolus administration of intravenous contrast. CONTRAST:  196mL OMNIPAQUE IOHEXOL 300 MG/ML  SOLN COMPARISON:  01/04/2018 FINDINGS: Lower chest: Lung bases are clear. Hepatobiliary: Focal fatty infiltration adjacent to the falciform ligament. No suspicious hepatic lesion. Gallbladder physiologically distended, no calcified stone. No biliary  dilatation. Pancreas: No ductal dilatation or inflammation. Spleen: Normal in size without focal abnormality. Adrenals/Urinary Tract: Normal adrenal glands. No hydronephrosis or perinephric edema. Homogeneous renal enhancement with symmetric excretion on delayed phase imaging. There is asymmetric left renal atrophy. Urinary bladder nondistended not well evaluated. Stomach/Bowel: Diffuse colonic wall thickening and pericolonic edema extending from the hepatic flexure through the mid descending colon consistent with colitis. Scattered distal colonic diverticula without focal diverticulitis. Chain sutures in the sigmoid. Prior appendectomy. Stomach and small bowel are unremarkable. Vascular/Lymphatic: Aortic and branch atherosclerosis. Portal vein is patent. Circumaortic left renal vein, lower left renal vein drains to the left iliac vein. Mesenteric vessels are patent. No adenopathy. Reproductive: Uterine calcification likely fibroid. 15 mm right adnexal cyst, unchanged/diminished from prior. Other: No ascites or free air. No intra-abdominal abscess. Musculoskeletal: There are no acute or suspicious osseous abnormalities. IMPRESSION: 1. Colitis extending from the hepatic flexure through the mid descending colon, likely infectious or inflammatory. 2. Distal colonic diverticulosis without focal diverticulitis. Aortic Atherosclerosis (ICD10-I70.0). Electronically Signed   By: Keith Rake M.D.   On: 12/09/2019 18:09      The results of significant diagnostics from  this hospitalization (including imaging, microbiology, ancillary and laboratory) are listed below for reference.     Microbiology: Recent Results (from the past 240 hour(s))  SARS Coronavirus 2 by RT PCR (hospital order, performed in [facility] hospital lab) Nasopharyngeal Nasopharyngeal Swab     Status: None   Collection Time: 12/09/19 10:21 PM   Specimen: Nasopharyngeal Swab  Result Value Ref Range Status   SARS Coronavirus 2 NEGATIVE  NEGATIVE Final    Comment: (NOTE) SARS-CoV-2 target nucleic acids are NOT DETECTED. The SARS-CoV-2 RNA is generally detectable in upper and lower respiratory specimens during the acute phase of infection. The lowest concentration of SARS-CoV-2 viral copies this assay can detect is 250 copies / mL. A negative result does not preclude SARS-CoV-2 infection and should not be used as the sole basis for treatment or other patient management decisions.  A negative result may occur with improper specimen collection / handling, submission of specimen other than nasopharyngeal swab, presence of viral mutation(s) within the areas targeted by this assay, and inadequate number of viral copies (<250 copies / mL). A negative result must be combined with clinical observations, patient history, and epidemiological information. Fact Sheet for Patients:   StrictlyIdeas.no Fact Sheet for Healthcare Providers: BankingDealers.co.za This test is not yet approved or cleared  by the [place] FDA and has been authorized for detection and/or diagnosis of SARS-CoV-2 by FDA under an Emergency Use Authorization (EUA).  This EUA will remain in effect (meaning this test can be used) for the duration of the COVID-19 declaration under Section 564(b)(1) of the Act, 21 U.S.C. section 360bbb-3(b)(1), unless the authorization is terminated or revoked sooner. Performed at [facility], [address] [place], [place] [address]   C Difficile Quick Screen w PCR reflex     Status: None   Collection Time: 12/10/19  9:58 AM   Specimen: STOOL  Result Value Ref Range Status   C Diff antigen NEGATIVE NEGATIVE Final   C Diff toxin NEGATIVE NEGATIVE Final   C Diff interpretation No C. difficile detected.  Final    Comment: Performed at [facility], [place] [address] [place], [place] [address]     Labs: BNP (last 3 results) No results for  input(s): BNP in the last 8760 hours. Basic Metabolic Panel: Recent Labs  Lab 12/09/19 1507 12/10/19 0237 12/11/19 0459 12/12/19 0502  NA 135 138 137 139  K 4.6 4.2 3.6 4.2  CL 103 107 106 109  CO2 24 23 22 22   GLUCOSE 136* 129* 174* 122*  BUN 28* 22 10 10   CREATININE 1.15* 1.09* 1.08* 1.04*  CALCIUM 8.8* 8.4* 8.0* 8.2*  MG  --   --  1.8 2.0   Liver Function Tests: Recent Labs  Lab 12/09/19 1507 12/10/19 0237 12/11/19 0459 12/12/19 0502  AST 27 22 16 18   ALT 22 21 15 13   ALKPHOS 59 56 47 46  BILITOT 0.7 1.3* 0.8 0.6  PROT 7.3 6.6 5.6* 5.6*  ALBUMIN 4.0 3.5 3.0* 3.0*   Recent Labs  Lab 12/09/19 1507  LIPASE 39   No results for input(s): AMMONIA in the last 168 hours. CBC: Recent Labs  Lab 12/09/19 1507 12/09/19 1507 12/10/19 0237 12/10/19 0639 12/10/19 1004 12/11/19 0459 12/12/19 0502  WBC 11.2*   < > 13.9* 13.8* 15.7* 14.2* 11.4*  NEUTROABS 9.7*  --   --   --   --   --   --   HGB 13.2   < >  12.3 11.5* 12.2 10.7* 10.7*  HCT 39.1   < > 36.4 34.5* 36.6 32.5* 32.7*  MCV 89.9   < > 90.8 91.3 91.0 93.7 91.6  PLT 253   < > 204 214 229 187 204   < > = values in this interval not displayed.   Cardiac Enzymes: No results for input(s): CKTOTAL, CKMB, CKMBINDEX, TROPONINI in the last 168 hours. BNP: Invalid input(s): POCBNP CBG: No results for input(s): GLUCAP in the last 168 hours. D-Dimer No results for input(s): DDIMER in the last 72 hours. Hgb A1c No results for input(s): HGBA1C in the last 72 hours. Lipid Profile No results for input(s): CHOL, HDL, LDLCALC, TRIG, CHOLHDL, LDLDIRECT in the last 72 hours. Thyroid function studies No results for input(s): TSH, T4TOTAL, T3FREE, THYROIDAB in the last 72 hours.  Invalid input(s): FREET3 Anemia work up No results for input(s): VITAMINB12, FOLATE, FERRITIN, TIBC, IRON, RETICCTPCT in the last 72 hours. Urinalysis    Component Value Date/Time   COLORURINE STRAW (A) 01/04/2018 1037   APPEARANCEUR CLEAR  01/04/2018 1037   LABSPEC <1.005 (L) 01/04/2018 1037   PHURINE 6.5 01/04/2018 1037   GLUCOSEU NEGATIVE 01/04/2018 1037   GLUCOSEU NEGATIVE 01/07/2015 0834   HGBUR TRACE (A) 01/04/2018 1037   BILIRUBINUR NEGATIVE 01/04/2018 1037   KETONESUR 15 (A) 01/04/2018 1037   PROTEINUR NEGATIVE 01/04/2018 1037   UROBILINOGEN 0.2 01/07/2015 0834   NITRITE NEGATIVE 01/04/2018 1037   LEUKOCYTESUR NEGATIVE 01/04/2018 1037   Sepsis Labs Invalid input(s): PROCALCITONIN,  WBC,  LACTICIDVEN Microbiology Recent Results (from the past 240 hour(s))  SARS Coronavirus 2 by RT PCR (hospital order, performed in [place] hospital lab) Nasopharyngeal Nasopharyngeal Swab     Status: None   Collection Time: 12/09/19 10:21 PM   Specimen: Nasopharyngeal Swab  Result Value Ref Range Status   SARS Coronavirus 2 NEGATIVE NEGATIVE Final    Comment: (NOTE) SARS-CoV-2 target nucleic acids are NOT DETECTED. The SARS-CoV-2 RNA is generally detectable in upper and lower respiratory specimens during the acute phase of infection. The lowest concentration of SARS-CoV-2 viral copies this assay can detect is 250 copies / mL. A negative result does not preclude SARS-CoV-2 infection and should not be used as the sole basis for treatment or other patient management decisions.  A negative result may occur with improper specimen collection / handling, submission of specimen other than nasopharyngeal swab, presence of viral mutation(s) within the areas targeted by this assay, and inadequate number of viral copies (<250 copies / mL). A negative result must be combined with clinical observations, patient history, and epidemiological information. Fact Sheet for Patients:   StrictlyIdeas.no Fact Sheet for Healthcare Providers: BankingDealers.co.za This test is not yet approved or cleared  by the [place] FDA and has been authorized for detection and/or diagnosis of SARS-CoV-2  by FDA under an Emergency Use Authorization (EUA).  This EUA will remain in effect (meaning this test can be used) for the duration of the COVID-19 declaration under Section 564(b)(1) of the Act, 21 U.S.C. section 360bbb-3(b)(1), unless the authorization is terminated or revoked sooner. Performed at [facility], [address] [place], [place] [address]   C Difficile Quick Screen w PCR reflex     Status: None   Collection Time: 12/10/19  9:58 AM   Specimen: STOOL  Result Value Ref Range Status   C Diff antigen NEGATIVE NEGATIVE Final   C Diff toxin NEGATIVE NEGATIVE Final   C Diff interpretation No C. difficile  detected.  Final    Comment: Performed at [facility], [place] [address], [place], [place] [address]     Time coordinating discharge:  I have spent 35 minutes face to face with the patient and on the ward discussing the patients care, assessment, plan and disposition with other care givers. >50% of the time was devoted counseling the patient about the risks and benefits of treatment/Discharge disposition and coordinating care.   SIGNED:   Damita Lack, MD  [facility] 12/12/2019, 10:16 AM   If 7PM-7AM, please contact night-coverage

## 2019-12-12 NOTE — Plan of Care (Signed)

## 2019-12-12 NOTE — Progress Notes (Signed)
Patient is stable, ambulatory and ready to d/c home with husband.

## 2019-12-12 NOTE — Plan of Care (Signed)

## 2019-12-14 ENCOUNTER — Encounter: Payer: Self-pay | Admitting: Family Medicine

## 2019-12-14 ENCOUNTER — Telehealth: Payer: Self-pay | Admitting: *Deleted

## 2019-12-14 ENCOUNTER — Other Ambulatory Visit: Payer: Self-pay | Admitting: Family Medicine

## 2019-12-14 MED ORDER — NYSTATIN 100000 UNIT/ML MT SUSP
5.0000 mL | Freq: Four times a day (QID) | OROMUCOSAL | 0 refills | Status: DC
Start: 2019-12-14 — End: 2020-01-21

## 2019-12-14 NOTE — Telephone Encounter (Signed)
1st attempt. Unable to reach patient.   

## 2019-12-15 NOTE — Telephone Encounter (Signed)
I have made two attempts and have been unable to reach patient. Pt has hospital follow up scheduled w/ PCP 12/18/19.  

## 2019-12-15 NOTE — Telephone Encounter (Signed)
Correction--TCM/ hospital follow up is scheduled w/ Mackie Pai, PA 12/18/19.

## 2019-12-16 ENCOUNTER — Other Ambulatory Visit: Payer: Self-pay

## 2019-12-16 ENCOUNTER — Encounter: Payer: Self-pay | Admitting: Internal Medicine

## 2019-12-16 ENCOUNTER — Ambulatory Visit (INDEPENDENT_AMBULATORY_CARE_PROVIDER_SITE_OTHER): Payer: Medicare HMO | Admitting: Internal Medicine

## 2019-12-16 ENCOUNTER — Encounter: Payer: Self-pay | Admitting: Family Medicine

## 2019-12-16 VITALS — BP 136/84 | HR 72 | Temp 97.1°F | Resp 16 | Ht 62.0 in | Wt 136.2 lb

## 2019-12-16 DIAGNOSIS — I809 Phlebitis and thrombophlebitis of unspecified site: Secondary | ICD-10-CM

## 2019-12-16 DIAGNOSIS — K529 Noninfective gastroenteritis and colitis, unspecified: Secondary | ICD-10-CM

## 2019-12-16 MED ORDER — SACCHAROMYCES BOULARDII 250 MG PO CAPS: 250.0000 mg | ORAL_CAPSULE | Freq: Two times a day (BID) | ORAL | 0 refills | Status: DC

## 2019-12-16 MED ORDER — CEPHALEXIN 500 MG PO CAPS
500.0000 mg | ORAL_CAPSULE | Freq: Four times a day (QID) | ORAL | 0 refills | Status: DC
Start: 2019-12-16 — End: 2020-01-21

## 2019-12-16 NOTE — Progress Notes (Signed)
Pre visit review using our clinic review tool, if applicable. No additional management support is needed unless otherwise documented below in the visit note. 

## 2019-12-16 NOTE — Patient Instructions (Addendum)
Finish antibiotics for colitis as recommended  We are referring you to the gastroenterologist  If your stomach symptoms resurface, call immediately  Good hydration, continue the probiotics  For the phlebitis: Warm compress Start cephalexin for 5 days Call if not gradually better

## 2019-12-16 NOTE — Progress Notes (Signed)
Subjective:    Patient ID: Catherine Prince, female    DOB: February 25, 1946, 74 y.o.   MRN: [id number]  DOS:  12/16/2019 Type of visit - description: TCM  Admitted to the hospital 12/09/2019, discharge 12/12/2019. She presented with multiple episodes of diarrhea with blood, dx w/ colitis C. difficile negative. CT scan showed colitis involving the hepatic flexure through mid part of the descending colon.   GI no consulted, no colonoscopy Prescribed Cipro and Flagyl for 5 days post discharge. She also developed oral thrush, rx nystatin. Overall improved and was discharged home  In addition to TCM, she also noted a lump at the right armpit, also redness/swelling at the area where she had an IV, right arm. Apparently that particular IV infiltrated and they placed another one at the left hand.   Review of Systems Currently GI symptoms improve: No nausea vomiting, no abdominal pain Stools are still loose and abnormal but no more blood. No dysuria, gross hematuria  Past Medical History:  Diagnosis Date   Allergy    mild seasonal allergies   Arthritis    Cancer ([place])    Cataract    Left eye   Diverticulitis    Diverticulosis of colon without hemorrhage 12/09/2014   H/O diverticulitis of colon 12/09/2014   7.5 inches removed after rupture in 2010, has done well since Also rook appendix and 1 ovary   History of chicken pox    Hyperlipidemia    Impaired fasting glucose 12/09/2014   Macular hole of left eye    Measles    Mumps    Nocturia 12/09/2014   Osteoarthritis 12/09/2014   Osteopenia 04/01/2015   Personal history of cardiac arrhythmia 03/30/2016   Vitamin D deficiency     Past Surgical History:  Procedure Laterality Date   APPENDECTOMY     BLEPHAROPLASTY Bilateral 08/02/2017   BREAST CYST ASPIRATION     CATARACT EXTRACTION Left    COLON SURGERY  2010   pt. reports intestines burst   EYE SURGERY     Left eye, retinal hole, Dr Manson Allan[facility]  [place] Left    09/2013   REPLACEMENT TOTAL KNEE Right 2016   RIGHT OOPHORECTOMY Right    during Diverticular surgery    Allergies as of 12/16/2019   No Known Allergies     Medication List       Accurate as of December 16, 2019  3:33 PM. If you have any questions, ask your nurse or doctor.        acetaminophen 500 MG tablet Commonly known as: TYLENOL Take 500 mg by mouth every 4 (four) hours as needed for mild pain.   cholecalciferol 1000 units tablet Commonly known as: VITAMIN D Take 2,000 Units by mouth daily.   ciprofloxacin 500 MG tablet Commonly known as: Cipro Take 1 tablet (500 mg total) by mouth 2 (two) times daily for 5 days.   metroNIDAZOLE 500 MG tablet Commonly known as: Flagyl Take 1 tablet (500 mg total) by mouth 3 (three) times daily for 5 days.   nystatin 100000 UNIT/ML suspension Commonly known as: MYCOSTATIN Take 5 mLs (500,000 Units total) by mouth 4 (four) times daily. Swish and spit   ondansetron 4 MG disintegrating tablet Commonly known as: Zofran ODT Take 1 tablet (4 mg total) by mouth every 8 (eight) hours as needed for nausea or vomiting.   saccharomyces boulardii 250 MG capsule Commonly known as: Florastor Take 1 capsule (250  mg total) by mouth 2 (two) times daily for 14 days.          Objective:   Physical Exam Musculoskeletal:       Arms:    BP 136/84 (BP Location: Left Arm, Patient Position: Sitting, Cuff Size: Small)    Pulse 72    Temp (!) 97.1 F (36.2 C) (Temporal)    Resp 16    Ht 5\' 2"  (1.575 m)    Wt 136 lb 4 oz (61.8 kg)    SpO2 99%    BMI 24.92 kg/m  General:   Well developed, NAD, BMI noted.  HEENT:  Normocephalic . Face symmetric, atraumatic Lungs:  CTA B Normal respiratory effort, no intercostal retractions, no accessory muscle use. Heart: RRR,  no murmur.  Abdomen:  Not distended, soft, non-tender. No rebound or rigidity. Axillary area: No mass Upper extremities: See graphic.   Other than what is described, upper extremities are symmetric Skin: Not pale. Not jaundice Lower extremities: no pretibial edema bilaterally  Neurologic:  alert & oriented X3.  Speech normal, gait appropriate for age and unassisted Psych--  Cognition and judgment appear intact.  Cooperative with normal attention span and concentration.  Behavior appropriate. No anxious or depressed appearing.     Assessment     74 year old female, PMH includes diverticulitis 2010, status post sigmoid colectomy, multiple episodes of rectal bleeding, h/o SBO, presents for  TCM:  Colitis: Symptoms subsiding with oral antibiotics.  She will finish Flagyl and Cipro tomorrow Referred to GI   Labs next week: CMP, CBC . Phlebitis: Right arm phlebitis likely from previous IV insertion. Plan: Keflex, warm compress, call if not gradually better RTC: Already has an appointment to see PCP in August  This visit occurred during the SARS-CoV-2 public health emergency.  Safety protocols were in place, including screening questions prior to the visit, additional usage of staff PPE, and extensive cleaning of exam room while observing appropriate contact time as indicated for disinfecting solutions.

## 2019-12-17 ENCOUNTER — Encounter: Payer: Self-pay | Admitting: Family Medicine

## 2019-12-18 ENCOUNTER — Inpatient Hospital Stay: Payer: Medicare HMO | Admitting: Medical

## 2019-12-23 ENCOUNTER — Other Ambulatory Visit (INDEPENDENT_AMBULATORY_CARE_PROVIDER_SITE_OTHER): Payer: Medicare HMO

## 2019-12-23 DIAGNOSIS — K529 Noninfective gastroenteritis and colitis, unspecified: Secondary | ICD-10-CM | POA: Diagnosis not present

## 2019-12-23 LAB — COMPREHENSIVE METABOLIC PANEL
ALT: 12 U/L (ref 0–35)
AST: 17 U/L (ref 0–37)
Albumin: 4.1 g/dL (ref 3.5–5.2)
Alkaline Phosphatase: 54 U/L (ref 39–117)
BUN: 23 mg/dL (ref 6–23)
CO2: 28 mEq/L (ref 19–32)
Calcium: 9.9 mg/dL (ref 8.4–10.5)
Chloride: 101 mEq/L (ref 96–112)
Creatinine, Ser: 1.13 mg/dL (ref 0.40–1.20)
GFR: 47.17 mL/min — ABNORMAL LOW (ref >60.00)
Glucose, Bld: 101 mg/dL — ABNORMAL HIGH (ref 70–99)
Potassium: 4 mEq/L (ref 3.5–5.1)
Sodium: 138 mEq/L (ref 135–145)
Total Bilirubin: 0.6 mg/dL (ref 0.2–1.2)
Total Protein: 7.2 g/dL (ref 6.0–8.3)

## 2019-12-23 LAB — CBC WITH DIFFERENTIAL/PLATELET
Basophils Absolute: 0.1 10*3/uL (ref 0.0–0.1)
Basophils Relative: 1.1 % (ref 0.0–3.0)
Eosinophils Absolute: 0.1 10*3/uL (ref 0.0–0.7)
Eosinophils Relative: 1.7 % (ref 0.0–5.0)
HCT: 36.4 % (ref 36.0–46.0)
Hemoglobin: 12.6 g/dL (ref 12.0–15.0)
Lymphocytes Relative: 23.9 % (ref 12.0–46.0)
Lymphs Abs: 2 10*3/uL (ref 0.7–4.0)
MCHC: 34.5 g/dL (ref 30.0–36.0)
MCV: 88.6 fl (ref 78.0–100.0)
Monocytes Absolute: 0.6 10*3/uL (ref 0.1–1.0)
Monocytes Relative: 7.9 % (ref 3.0–12.0)
Neutro Abs: 5.3 10*3/uL (ref 1.4–7.7)
Neutrophils Relative %: 65.4 % (ref 43.0–77.0)
Platelets: 378 10*3/uL (ref 150.0–400.0)
RBC: 4.11 Mil/uL (ref 3.87–5.11)
RDW: 13.4 % (ref 11.5–15.5)
WBC: 8.2 10*3/uL (ref 4.0–10.5)

## 2020-01-15 ENCOUNTER — Other Ambulatory Visit (HOSPITAL_BASED_OUTPATIENT_CLINIC_OR_DEPARTMENT_OTHER): Payer: Self-pay | Admitting: Family Medicine

## 2020-01-15 DIAGNOSIS — Z1231 Encounter for screening mammogram for malignant neoplasm of breast: Secondary | ICD-10-CM

## 2020-01-21 ENCOUNTER — Encounter: Payer: Self-pay | Admitting: Physician Assistant

## 2020-01-21 ENCOUNTER — Ambulatory Visit: Payer: Medicare HMO | Admitting: Physician Assistant

## 2020-01-21 VITALS — BP 124/78 | HR 80 | Ht 62.0 in | Wt 135.0 lb

## 2020-01-21 DIAGNOSIS — K529 Noninfective gastroenteritis and colitis, unspecified: Secondary | ICD-10-CM

## 2020-01-21 DIAGNOSIS — Z86010 Personal history of colonic polyps: Secondary | ICD-10-CM

## 2020-01-21 MED ORDER — SUTAB 1479-225-188 MG PO TABS: 1.0000 | ORAL_TABLET | Freq: Once | ORAL | 0 refills | Status: AC

## 2020-01-21 NOTE — Progress Notes (Signed)
Chief Complaint: Follow-up after hospitalization for colitis  HPI:    Catherine Prince is a 74 year old Caucasian female with a past medical history as listed below including perforated diverticulitis requiring partial sigmoid colectomy in 2010, known to Dr. [name], who presents to clinic today for follow-up after being hospitalized for colitis.    01/03/2017 colonoscopy with two 1 mm polyps in the descending colon and in the cecum, diverticulosis in the descending colon and patent end-to-end colocolonic anastomosis characterized by healthy-appearing mucosa.  Pathology showed tubular adenomas and repeat was recommended in 5 years.    12/09/2019-12/12/2019 patient hospitalized after a CT of the abdomen pelvis showed colitis extending from the hepatic flexure through the mid descending colon, likely infectious or inflammatory.  Distal colonic diverticulosis without focal diverticulitis and aortic atherosclerosis.  Patient was treated with antibiotics in the hospital.  Her C. difficile was negative.  She was given Cipro and Flagyl at discharge for five further days and told she may need a possible repeat colonoscopy in 6 to 8 weeks.    Today, the patient tells me that this "colitis" caught her out of the blue.  She went to sleep after eating something her husband had made, lasagna with pepperoni, and woke up with her abdominal pain and sweating from head to toe and having lots of diarrhea.  Ended up in the hospital as above.  Tells me that since being out and finishing the antibiotic she has had no further problems.  No further abdominal pain and bowel movements have returned to normal.  Does continue with a somewhat "full feeling" at the top of her abdomen which is new for her, worse after eating.    Denies fever, chills, weight loss or blood in her stool.     Past Medical History:  Diagnosis Date  . Allergy    mild seasonal allergies  . Arthritis   . Cancer ([place])   . Cataract    Left eye  . Diverticulitis    . Diverticulosis of colon without hemorrhage 12/09/2014  . H/O diverticulitis of colon 12/09/2014   7.5 inches removed after rupture in 2010, has done well since Also rook appendix and 1 ovary  . History of chicken pox   . Hyperlipidemia   . Impaired fasting glucose 12/09/2014  . Macular hole of left eye   . Measles   . Mumps   . Nocturia 12/09/2014  . Osteoarthritis 12/09/2014  . Osteopenia 04/01/2015  . Personal history of cardiac arrhythmia 03/30/2016  . Vitamin D deficiency     Past Surgical History:  Procedure Laterality Date  . APPENDECTOMY    . BLEPHAROPLASTY Bilateral 08/02/2017  . BREAST CYST ASPIRATION    . CATARACT EXTRACTION Left   . COLON SURGERY  2010   pt. reports intestines burst  . EYE SURGERY     Left eye, retinal hole, Dr Manson Allan, at [facility] in [place] Left    09/2013  . REPLACEMENT TOTAL KNEE Right 2016  . RIGHT OOPHORECTOMY Right    during Diverticular surgery    Current Outpatient Medications  Medication Sig Dispense Refill  . acetaminophen (TYLENOL) 500 MG tablet Take 500 mg by mouth every 4 (four) hours as needed for mild pain.     . cholecalciferol (VITAMIN D) 1000 units tablet Take 2,000 Units by mouth daily.    Marland Kitchen nystatin (MYCOSTATIN) 100000 UNIT/ML suspension Take 5 mLs (500,000 Units total) by mouth 4 (four) times daily. Swish and spit 473  mL 0  . ondansetron (ZOFRAN ODT) 4 MG disintegrating tablet Take 1 tablet (4 mg total) by mouth every 8 (eight) hours as needed for nausea or vomiting. 20 tablet 0  . saccharomyces boulardii (FLORASTOR) 250 MG capsule Take 1 capsule (250 mg total) by mouth 2 (two) times daily. 60 capsule 0   No current facility-administered medications for this visit.    Allergies as of 01/21/2020  . (No Known Allergies)    Family History  Problem Relation Age of Onset  . Heart disease Father 51  . Thyroid disease Mother   . Diabetes Mother        developed in her 46's  . Dementia Mother         mini strokes  . Heart disease Maternal Grandmother   . Prostate cancer Maternal Grandfather   . Heart disease Paternal Grandmother   . Heart disease Paternal Grandfather   . Colon cancer Neg Hx   . Esophageal cancer Neg Hx   . Stomach cancer Neg Hx   . Pancreatic cancer Neg Hx     Social History   Socioeconomic History  . Marital status: Married    Spouse name: Not on file  . Number of children: Not on file  . Years of education: 33  . Highest education level: Not on file  Occupational History  . Occupation: Web designer at [employer]  . Smoking status: Never Smoker  . Smokeless tobacco: Never Used  Substance and Sexual Activity  . Alcohol use: Yes    Alcohol/week: 0.0 standard drinks    Comment: occassional of wine  . Drug use: No  . Sexual activity: Yes    Comment: lives with husband, works with her church, no dietary restrictions  Other Topics Concern  . Not on file  Social History Narrative  . Not on file   Social Determinants of Health   Financial Resource Strain:   . Difficulty of Paying Living Expenses:   Food Insecurity:   . Worried About Charity fundraiser in the Last Year:   . Arboriculturist in the Last Year:   Transportation Needs:   . Film/video editor (Medical):   Marland Kitchen Lack of Transportation (Non-Medical):   Physical Activity:   . Days of Exercise per Week:   . Minutes of Exercise per Session:   Stress:   . Feeling of Stress :   Social Connections:   . Frequency of Communication with Friends and Family:   . Frequency of Social Gatherings with Friends and Family:   . Attends Religious Services:   . Active Member of Clubs or Organizations:   . Attends Archivist Meetings:   Marland Kitchen Marital Status:   Intimate Partner Violence:   . Fear of Current or Ex-Partner:   . Emotionally Abused:   Marland Kitchen Physically Abused:   . Sexually Abused:     Review of Systems:    Constitutional: No weight loss, fever or chills Skin: No  rash  Cardiovascular: No chest pain Respiratory: No SOB  Gastrointestinal: See HPI and otherwise negative Genitourinary: No dysuria  Neurological: No headache, dizziness or syncope Musculoskeletal: No new muscle or joint pain Hematologic: No bleeding  Psychiatric: No history of depression or anxiety   Physical Exam:  Vital signs: BP 124/78   Pulse 80   Ht 5\' 2"  (1.575 m)   Wt 135 lb (61.2 kg)   BMI 24.69 kg/m   Constitutional:   Pleasant Caucasian female appears  to be in NAD, Well developed, Well nourished, alert and cooperative Head:  Normocephalic and atraumatic. Eyes:   PEERL, EOMI. No icterus. Conjunctiva pink. Ears:  Normal auditory acuity. Neck:  Supple Throat: Oral cavity and pharynx without inflammation, swelling or lesion.  Respiratory: Respirations even and unlabored. Lungs clear to auscultation bilaterally.   No wheezes, crackles, or rhonchi.  Cardiovascular: Normal S1, S2. No MRG. Regular rate and rhythm. No peripheral edema, cyanosis or pallor.  Gastrointestinal:  Soft, nondistended, nontender. No rebound or guarding. Normal bowel sounds. No appreciable masses or hepatomegaly. Rectal:  Not performed.  Msk:  Symmetrical without gross deformities. Without edema, no deformity or joint abnormality.  Neurologic:  Alert and  oriented x4;  grossly normal neurologically.  Skin:   Dry and intact without significant lesions or rashes. Psychiatric: Demonstrates good judgement and reason without abnormal affect or behaviors.  RELEVANT LABS AND IMAGING: CBC    Component Value Date/Time   WBC 8.2 12/23/2019 1214   RBC 4.11 12/23/2019 1214   HGB 12.6 12/23/2019 1214   HCT 36.4 12/23/2019 1214   PLT 378.0 12/23/2019 1214   MCV 88.6 12/23/2019 1214   MCH 30.0 12/12/2019 0502   MCHC 34.5 12/23/2019 1214   RDW 13.4 12/23/2019 1214   LYMPHSABS 2.0 12/23/2019 1214   MONOABS 0.6 12/23/2019 1214   EOSABS 0.1 12/23/2019 1214   BASOSABS 0.1 12/23/2019 1214    CMP       Component Value Date/Time   NA 138 12/23/2019 1214   K 4.0 12/23/2019 1214   CL 101 12/23/2019 1214   CO2 28 12/23/2019 1214   GLUCOSE 101 (H) 12/23/2019 1214   BUN 23 12/23/2019 1214   CREATININE 1.13 12/23/2019 1214   CALCIUM 9.9 12/23/2019 1214   PROT 7.2 12/23/2019 1214   ALBUMIN 4.1 12/23/2019 1214   AST 17 12/23/2019 1214   ALT 12 12/23/2019 1214   ALKPHOS 54 12/23/2019 1214   BILITOT 0.6 12/23/2019 1214   GFRNONAA 53 (L) 12/12/2019 0502   GFRAA >60 12/12/2019 0502    Assessment: 1.  Colitis: Inflammation seen on CT, in the hospital with abdominal pain and diarrhea, C. difficile negative, treated with Cipro and Flagyl and symptoms improved; consider most likely infectious versus viral etiology 2.  History of adenomatous polyps: Last colonoscopy in 2018 with recommendation for repeat in 5 years  Plan: 1.  Discussed with the patient that she has had a fairly recent colonoscopy and we do not necessarily need to repeat one given that most likely this colitis was infectious/viral with complete resolution of symptoms now, but she would like to go ahead and do so to make sure everything is okay in there. 2.  Scheduled the patient for colonoscopy in the [facility] with Dr. [name].  Did discuss risks, benefits, limitations and alternatives and patient agrees to proceed.  We will schedule this in 2 to 3 weeks. 3.  Patient to continue her daily probiotic. 4.  Patient to follow in clinic for recommendations from Dr. [name] after time of procedure.  Ellouise Newer, PA-C [facility] 01/21/2020, 10:26 AM  Cc: Colon Branch, MD

## 2020-01-21 NOTE — Patient Instructions (Signed)
If you are age 73 or older, your body mass index should be between 23-30. Your Body mass index is 24.69 kg/m. If this is out of the aforementioned range listed, please consider follow up with your Primary Care Provider.  If you are age 57 or younger, your body mass index should be between 19-25. Your Body mass index is 24.69 kg/m. If this is out of the aformentioned range listed, please consider follow up with your Primary Care Provider.  __________________________________________________________________  Continue daily probiotic.   __________________________________________________________________  Catherine Prince have been scheduled for a colonoscopy. Please follow written instructions given to you at your visit today.  Please pick up your prep supplies at the pharmacy within the next 1-3 days. If you use inhalers (even only as needed), please bring them with you on the day of your procedure.  __________________________________________________________________  Due to recent changes in healthcare laws, you may see the results of your imaging and laboratory studies on MyChart before your provider has had a chance to review them.  We understand that in some cases there may be results that are confusing or concerning to you. Not all laboratory results come back in the same time frame and the provider may be waiting for multiple results in order to interpret others.  Please give [place] 48 hours in order for your provider to thoroughly review all the results before contacting the office for clarification of your results.

## 2020-01-22 ENCOUNTER — Ambulatory Visit (HOSPITAL_BASED_OUTPATIENT_CLINIC_OR_DEPARTMENT_OTHER)
Admission: RE | Admit: 2020-01-22 | Discharge: 2020-01-22 | Disposition: A | Discharge status: Home / Self Care | Payer: Medicare HMO | Source: Ambulatory Visit | Attending: Family Medicine | Admitting: Family Medicine

## 2020-01-22 ENCOUNTER — Other Ambulatory Visit: Payer: Self-pay

## 2020-01-22 DIAGNOSIS — Z1231 Encounter for screening mammogram for malignant neoplasm of breast: Secondary | ICD-10-CM | POA: Diagnosis not present

## 2020-01-22 NOTE — Progress Notes (Signed)
____________________________________________________________  Attending physician addendum:  Thank you for sending this case to me. I have reviewed the entire note, and the outlined plan seems appropriate.  I agree this sounds likely to been acute bacterial colitis.  Colonoscopy reasonable to exclude other causes or persistent conditions.  Wilfrid Lund, MD  ____________________________________________________________

## 2020-02-02 ENCOUNTER — Encounter: Payer: Medicare HMO | Admitting: Family Medicine

## 2020-02-04 ENCOUNTER — Encounter: Payer: Self-pay | Admitting: Family Medicine

## 2020-02-15 NOTE — Telephone Encounter (Signed)
Catherine Prince,  Can you and your team get in touch with the patient and help her get established with Dr. Einar Pheasant?  Thanks!

## 2020-02-15 NOTE — Telephone Encounter (Signed)
Lvm asking pt to give office a call.

## 2020-02-23 DIAGNOSIS — D225 Melanocytic nevi of trunk: Secondary | ICD-10-CM | POA: Diagnosis not present

## 2020-02-23 DIAGNOSIS — L821 Other seborrheic keratosis: Secondary | ICD-10-CM | POA: Diagnosis not present

## 2020-02-23 DIAGNOSIS — L578 Other skin changes due to chronic exposure to nonionizing radiation: Secondary | ICD-10-CM | POA: Diagnosis not present

## 2020-02-23 DIAGNOSIS — Z85828 Personal history of other malignant neoplasm of skin: Secondary | ICD-10-CM | POA: Diagnosis not present

## 2020-02-23 DIAGNOSIS — L814 Other melanin hyperpigmentation: Secondary | ICD-10-CM | POA: Diagnosis not present

## 2020-02-29 ENCOUNTER — Encounter: Payer: Self-pay | Admitting: Gastroenterology

## 2020-03-01 ENCOUNTER — Other Ambulatory Visit: Payer: Self-pay | Admitting: Gastroenterology

## 2020-03-01 ENCOUNTER — Ambulatory Visit (INDEPENDENT_AMBULATORY_CARE_PROVIDER_SITE_OTHER): Payer: Medicare HMO

## 2020-03-01 DIAGNOSIS — Z1159 Encounter for screening for other viral diseases: Secondary | ICD-10-CM | POA: Diagnosis not present

## 2020-03-01 LAB — SARS CORONAVIRUS 2 (TAT 6-24 HRS): SARS Coronavirus 2: NEGATIVE

## 2020-03-03 ENCOUNTER — Ambulatory Visit (AMBULATORY_SURGERY_CENTER): Payer: Medicare HMO | Admitting: Gastroenterology

## 2020-03-03 ENCOUNTER — Other Ambulatory Visit: Payer: Self-pay

## 2020-03-03 ENCOUNTER — Encounter: Payer: Self-pay | Admitting: Gastroenterology

## 2020-03-03 VITALS — BP 106/64 | HR 62 | Temp 97.3°F | Resp 15 | Ht 62.0 in | Wt 135.0 lb

## 2020-03-03 DIAGNOSIS — D122 Benign neoplasm of ascending colon: Secondary | ICD-10-CM

## 2020-03-03 DIAGNOSIS — Z1211 Encounter for screening for malignant neoplasm of colon: Secondary | ICD-10-CM | POA: Diagnosis not present

## 2020-03-03 DIAGNOSIS — K5730 Diverticulosis of large intestine without perforation or abscess without bleeding: Secondary | ICD-10-CM

## 2020-03-03 DIAGNOSIS — R933 Abnormal findings on diagnostic imaging of other parts of digestive tract: Secondary | ICD-10-CM | POA: Diagnosis not present

## 2020-03-03 HISTORY — PX: COLONOSCOPY: SHX174

## 2020-03-03 MED ORDER — SODIUM CHLORIDE 0.9 % IV SOLN: 500.0000 mL | Freq: Once | INTRAVENOUS | Status: DC

## 2020-03-03 NOTE — Op Note (Signed)
[facility] Patient Name: Catherine Prince Procedure Date: 03/03/2020 11:42 AM MRN: [id number] Endoscopist: Mallie Mussel L. [name] , MD Age: 74 Referring MD:  Date of Birth: 1945/11/07 Gender: Female Account #: [id number] Procedure:                Colonoscopy Indications:              Abnormal CT of the GI tract (diffuse colitis during                            acute illness, suspected infection. clinically                            resolved) Medicines:                Monitored Anesthesia Care Procedure:                Pre-Anesthesia Assessment:                           - Prior to the procedure, a History and Physical                            was performed, and patient medications and                            allergies were reviewed. The patient's tolerance of                            previous anesthesia was also reviewed. The risks                            and benefits of the procedure and the sedation                            options and risks were discussed with the patient.                            All questions were answered, and informed consent                            was obtained. Prior Anticoagulants: The patient has                            taken no previous anticoagulant or antiplatelet                            agents. ASA Grade Assessment: II - A patient with                            mild systemic disease. After reviewing the risks                            and benefits, the patient was deemed in  satisfactory condition to undergo the procedure.                           After obtaining informed consent, the colonoscope                            was passed under direct vision. Throughout the                            procedure, the patient's blood pressure, pulse, and                            oxygen saturations were monitored continuously. The                            Colonoscope was introduced through the anus and                             advanced to the the cecum, identified by                            appendiceal orifice and ileocecal valve. The                            colonoscopy was performed without difficulty. The                            patient tolerated the procedure well. The quality                            of the bowel preparation was excellent. The                            ileocecal valve, appendiceal orifice, and rectum                            were photographed. Scope In: 11:57:51 AM Scope Out: 12:07:54 PM Scope Withdrawal Time: 0 hours 7 minutes 6 seconds  Total Procedure Duration: 0 hours 10 minutes 3 seconds  Findings:                 The perianal and digital rectal examinations were                            normal.                           A few small-mouthed diverticula were found in the                            left colon and right colon.                           A diminutive polyp was found in the ascending  colon. The polyp was flat. The polyp was removed                            with a cold biopsy forceps. Resection and retrieval                            were complete.                           There was evidence of a prior end-to-end                            colo-rectal anastomosis in the proximal rectum.                            This was patent and was characterized by healthy                            appearing mucosa.                           The exam was otherwise without abnormality on                            direct and retroflexion views. Complications:            No immediate complications. Estimated Blood Loss:     Estimated blood loss was minimal. Impression:               - Diverticulosis in the left colon and in the right                            colon.                           - One diminutive polyp in the ascending colon,                            removed with a cold biopsy forceps. Resected and                             retrieved.                           - Patent end-to-end colo-rectal anastomosis,                            characterized by healthy appearing mucosa.                           - The examination was otherwise normal on direct                            and retroflexion views. Recommendation:           - Patient has a contact number available for  emergencies. The signs and symptoms of potential                            delayed complications were discussed with the                            patient. Return to normal activities tomorrow.                            Written discharge instructions were provided to the                            patient.                           - Resume previous diet.                           - Continue present medications.                           - Await pathology results.                           - Based on current guidelines, age and findings                            today, no routine surveillance colonoscopy                            recommended. Mylinh Cragg L. [name], MD 03/03/2020 12:22:17 PM This report has been signed electronically.

## 2020-03-03 NOTE — Progress Notes (Signed)
Vital signs checked by:AB  The medical and surgical history was reviewed and verified with the patient.

## 2020-03-03 NOTE — Progress Notes (Signed)
Called to room to assist during endoscopic procedure.  Patient ID and intended procedure confirmed with present staff. Received instructions for my participation in the procedure from the performing physician.  

## 2020-03-03 NOTE — Patient Instructions (Signed)
Handout provided on polyps and diverticulosis.  ? ?YOU HAD AN ENDOSCOPIC PROCEDURE TODAY AT THE [facility]:   Refer to the procedure report that was given to you for any specific questions about what was found during the examination.  If the procedure report does not answer your questions, please call your gastroenterologist to clarify.  If you requested that your care partner not be given the details of your procedure findings, then the procedure report has been included in a sealed envelope for you to review at your convenience later. ? ?YOU SHOULD EXPECT: Some feelings of bloating in the abdomen. Passage of more gas than usual.  Walking can help get rid of the air that was put into your GI tract during the procedure and reduce the bloating. If you had a lower endoscopy (such as a colonoscopy or flexible sigmoidoscopy) you may notice spotting of blood in your stool or on the toilet paper. If you underwent a bowel prep for your procedure, you may not have a normal bowel movement for a few days. ? ?Please Note:  You might notice some irritation and congestion in your nose or some drainage.  This is from the oxygen used during your procedure.  There is no need for concern and it should clear up in a day or so. ? ?SYMPTOMS TO REPORT IMMEDIATELY: ? ?Following lower endoscopy (colonoscopy or flexible sigmoidoscopy): ? Excessive amounts of blood in the stool ? Significant tenderness or worsening of abdominal pains ? Swelling of the abdomen that is new, acute ? Fever of 100?F or higher ? ?For urgent or emergent issues, a gastroenterologist can be reached at any hour by calling [phone]. ?Do not use MyChart messaging for urgent concerns.  ? ? ?DIET:  We do recommend a small meal at first, but then you may proceed to your regular diet.  Drink plenty of fluids but you should avoid alcoholic beverages for 24 hours. ? ?ACTIVITY:  You should plan to take it easy for the rest of today and you should NOT DRIVE  or use heavy machinery until tomorrow (because of the sedation medicines used during the test).   ? ?FOLLOW UP: ?Our staff will call the number listed on your records 48-72 hours following your procedure to check on you and address any questions or concerns that you may have regarding the information given to you following your procedure. If we do not reach you, we will leave a message.  We will attempt to reach you two times.  During this call, we will ask if you have developed any symptoms of COVID 19. If you develop any symptoms (ie: fever, flu-like symptoms, shortness of breath, cough etc.) before then, please call [phone].  If you test positive for Covid 19 in the 2 weeks post procedure, please call and report this information to us.   ? ?If any biopsies were taken you will be contacted by phone or by letter within the next 1-3 weeks.  Please call us at [phone] if you have not heard about the biopsies in 3 weeks.  ? ? ?SIGNATURES/CONFIDENTIALITY: ?You and/or your care partner have signed paperwork which will be entered into your electronic medical record.  These signatures attest to the fact that that the information above on your After Visit Summary has been reviewed and is understood.  Full responsibility of the confidentiality of this discharge information lies with you and/or your care-partner. ? ?

## 2020-03-03 NOTE — Progress Notes (Signed)
To PACU, VSS. Report to [email or web address] 

## 2020-03-08 ENCOUNTER — Telehealth: Payer: Self-pay | Admitting: *Deleted

## 2020-03-08 NOTE — Telephone Encounter (Signed)
First follow up call made. Left message. 

## 2020-03-08 NOTE — Telephone Encounter (Signed)
°  Follow up Call-  Call back number 03/03/2020  Post procedure Call Back phone  # [phone]  Permission to leave phone message Yes  Some recent data might be hidden    LMOM to call back with any questions or concerns.  Also, call back if patient has developed fever, respiratory issues or been dx with COVID or had any family members or close contacts diagnosed since her procedure.

## 2020-03-10 ENCOUNTER — Encounter: Payer: Self-pay | Admitting: Gastroenterology

## 2020-04-18 ENCOUNTER — Ambulatory Visit (INDEPENDENT_AMBULATORY_CARE_PROVIDER_SITE_OTHER): Payer: Medicare HMO | Admitting: Family Medicine

## 2020-04-18 ENCOUNTER — Encounter: Payer: Self-pay | Admitting: Family Medicine

## 2020-04-18 ENCOUNTER — Other Ambulatory Visit: Payer: Self-pay

## 2020-04-18 VITALS — BP 110/78 | HR 90 | Temp 97.7°F | Ht 62.0 in | Wt 137.8 lb

## 2020-04-18 DIAGNOSIS — C4491 Basal cell carcinoma of skin, unspecified: Secondary | ICD-10-CM | POA: Diagnosis not present

## 2020-04-18 DIAGNOSIS — Z23 Encounter for immunization: Secondary | ICD-10-CM

## 2020-04-18 DIAGNOSIS — R7303 Prediabetes: Secondary | ICD-10-CM

## 2020-04-18 DIAGNOSIS — Z2821 Immunization not carried out because of patient refusal: Secondary | ICD-10-CM

## 2020-04-18 DIAGNOSIS — R94120 Abnormal auditory function study: Secondary | ICD-10-CM

## 2020-04-18 DIAGNOSIS — M899 Disorder of bone, unspecified: Secondary | ICD-10-CM | POA: Diagnosis not present

## 2020-04-18 NOTE — Patient Instructions (Signed)
Great to meet you! 

## 2020-04-18 NOTE — Assessment & Plan Note (Signed)
Had annual visit with her dermatologist in [facility] - dermatology specialist

## 2020-04-18 NOTE — Assessment & Plan Note (Signed)
No ear wax and family history. Referral to audiology

## 2020-04-18 NOTE — Assessment & Plan Note (Signed)
Discussed high risk for severe illness and that vaccine reduces this risk. Pt declined vaccination but is open to treatment if needed. Encouraged continued precautions.

## 2020-04-18 NOTE — Assessment & Plan Note (Signed)
Bony lesion on top of foot. Chronicity suspect benign lesion but will refer to podiatry for further evaluation and treatment options.

## 2020-04-18 NOTE — Progress Notes (Signed)
Subjective:     Catherine Prince is a 74 y.o. female presenting for Establish Care, Mass (top of L foot, painful ), and Hearing Problem     HPI  #Foot lesion - painful when she doesn't have good arch support - present x 2 years or more - sore and pointed  #fatigue - does not feel exhausted - family hx of thyroid disorder - denies temperature concerns - some hair loss   Review of Systems   Social History   Tobacco Use  Smoking Status Never Smoker  Smokeless Tobacco Never Used        Objective:    BP Readings from Last 3 Encounters:  04/18/20 110/78  03/03/20 106/64  01/21/20 124/78   Wt Readings from Last 3 Encounters:  04/18/20 137 lb 12 oz (62.5 kg)  03/03/20 135 lb (61.2 kg)  01/21/20 135 lb (61.2 kg)    BP 110/78   Pulse 90   Temp 97.7 F (36.5 C) (Temporal)   Ht 5\' 2"  (1.575 m)   Wt 137 lb 12 oz (62.5 kg)   SpO2 98%   BMI 25.19 kg/m    Physical Exam Constitutional:      General: She is not in acute distress.    Appearance: She is well-developed. She is not diaphoretic.  HENT:     Right Ear: Tympanic membrane and external ear normal. There is no impacted cerumen.     Left Ear: Tympanic membrane and external ear normal. There is no impacted cerumen.     Nose: Nose normal.  Eyes:     Conjunctiva/sclera: Conjunctivae normal.  Cardiovascular:     Rate and Rhythm: Normal rate and regular rhythm.     Heart sounds: No murmur heard.   Pulmonary:     Effort: Pulmonary effort is normal. No respiratory distress.     Breath sounds: Normal breath sounds. No wheezing.  Musculoskeletal:     Cervical back: Neck supple.  Skin:    General: Skin is warm and dry.     Capillary Refill: Capillary refill takes less than 2 seconds.  Neurological:     Mental Status: She is alert. Mental status is at baseline.  Psychiatric:        Mood and Affect: Mood normal.        Behavior: Behavior normal.       Hearing Screening   125Hz  250Hz  500Hz  1000Hz   2000Hz  3000Hz  4000Hz  6000Hz  8000Hz   Right ear:  20 40 40 20  40    Left ear:  20 20 20 20  20           Assessment & Plan:   Problem List Items Addressed This Visit      Musculoskeletal and Integument   Recurrent BCC (basal cell carcinoma)    Had annual visit with her dermatologist in [facility] - dermatology specialist        Other   Prediabetes    Stable CBG, discussed no need for repeat. Will check next year. Continue to maintain healthy weight. Regular exercise.       Failed hearing screening - Primary    No ear wax and family history. Referral to audiology      Relevant Orders   Ambulatory referral to Audiology   Lesion of bone of foot    Bony lesion on top of foot. Chronicity suspect benign lesion but will refer to podiatry for further evaluation and treatment options.       Relevant Orders  Ambulatory referral to Podiatry   COVID-19 vaccination declined    Discussed high risk for severe illness and that vaccine reduces this risk. Pt declined vaccination but is open to treatment if needed. Encouraged continued precautions.        Other Visit Diagnoses    Need for influenza vaccination       Relevant Orders   Flu Vaccine QUAD High Dose(Fluad) (Completed)       Return in about 1 year (around 04/18/2021).  [name], MD  This visit occurred during the SARS-CoV-2 public health emergency.  Safety protocols were in place, including screening questions prior to the visit, additional usage of staff PPE, and extensive cleaning of exam room while observing appropriate contact time as indicated for disinfecting solutions.

## 2020-04-18 NOTE — Assessment & Plan Note (Signed)
Stable CBG, discussed no need for repeat. Will check next year. Continue to maintain healthy weight. Regular exercise.

## 2020-04-19 ENCOUNTER — Telehealth: Payer: Self-pay | Admitting: Family Medicine

## 2020-04-19 NOTE — Telephone Encounter (Signed)
LVM for pt to rtn my call to r/s appt with [facility] on 06/01/20

## 2020-05-05 DIAGNOSIS — H903 Sensorineural hearing loss, bilateral: Secondary | ICD-10-CM | POA: Diagnosis not present

## 2020-06-01 ENCOUNTER — Ambulatory Visit: Payer: Medicare HMO

## 2020-06-03 ENCOUNTER — Other Ambulatory Visit: Payer: Self-pay

## 2020-06-03 ENCOUNTER — Ambulatory Visit (INDEPENDENT_AMBULATORY_CARE_PROVIDER_SITE_OTHER): Payer: Medicare HMO

## 2020-06-03 DIAGNOSIS — Z0000 Encounter for general adult medical examination without abnormal findings: Secondary | ICD-10-CM

## 2020-06-03 NOTE — Progress Notes (Signed)
Subjective:   Catherine Prince is a 74 y.o. female who presents for Medicare Annual (Subsequent) preventive examination.  Review of Systems: N/A      I connected with the patient today by telephone and verified that I am speaking with the correct person using two identifiers. Location patient: home Location nurse: work Persons participating in the telephone visit: patient, nurse.   I discussed the limitations, risks, security and privacy concerns of performing an evaluation and management service by telephone and the availability of in person appointments. I also discussed with the patient that there may be a patient responsible charge related to this service. The patient expressed understanding and verbally consented to this telephonic visit.        Cardiac Risk Factors include: advanced age (>20men, >6 women);Other (see comment), Risk factor comments: hyperlipidemia     Objective:    Today's Vitals   There is no height or weight on file to calculate BMI.  Advanced Directives 06/03/2020 12/10/2019 12/09/2019 10/21/2018 01/05/2018 01/04/2018 10/17/2017  Does Patient Have a Medical Advance Directive? Yes - No Yes Yes Yes Yes  Type of Paramedic of [place];Living will - - [place];Living will - Living will [place];Living will  Does patient want to make changes to medical advance directive? - - - No - Patient declined - No - Patient declined No - Patient declined  Copy of [place] in Chart? No - copy requested - - Yes - validated most recent copy scanned in chart (See row information) - No - copy requested No - copy requested  Would patient like information on creating a medical advance directive? - No - Patient declined - - - - -    Current Medications (verified) Outpatient Encounter Medications as of 06/03/2020  Medication Sig   acetaminophen (TYLENOL) 500 MG tablet Take 500 mg by mouth every 4 (four)  hours as needed for mild pain.    BIOTIN PO Take by mouth.   Calcium Polycarbophil (FIBER-CAPS PO) Take by mouth.   Multiple Vitamin (MULTIVITAMIN) capsule Take 1 capsule by mouth daily.   Probiotic Product (DIGESTIVE ADVANTAGE PO) Take by mouth.   VITAMIN D PO Take 4,000 Units by mouth.   ZINC PICOLINATE PO Take by mouth as needed.   No facility-administered encounter medications on file as of 06/03/2020.    Allergies (verified) Patient has no known allergies.   History: Past Medical History:  Diagnosis Date   Allergy    mild seasonal allergies   Arthritis    Cancer ([place])    precancerous on skin   Cataract    Left eye   Diverticulitis    Diverticulosis of colon without hemorrhage 12/09/2014   H/O diverticulitis of colon 12/09/2014   7.5 inches removed after rupture in 2010, has done well since Also rook appendix and 1 ovary   Heart murmur    History of chicken pox    Impaired fasting glucose 12/09/2014   Macular hole of left eye    Measles    Mumps    Nocturia 12/09/2014   Osteoarthritis 12/09/2014   Osteopenia 04/01/2015   Personal history of cardiac arrhythmia 03/30/2016   SBO (small bowel obstruction) ([place]) 01/04/2018   Vitamin D deficiency    Past Surgical History:  Procedure Laterality Date   APPENDECTOMY     BLEPHAROPLASTY Bilateral 08/02/2017   BREAST BIOPSY     BREAST CYST ASPIRATION     CATARACT EXTRACTION Left  COLON SURGERY  2010   pt. reports intestines burst   COLONOSCOPY  03/03/2020   EYE SURGERY     Left eye, retinal hole, Dr Manson Allan, at [facility] in [place] Left    09/2013   REPLACEMENT TOTAL KNEE Right 2016   RIGHT OOPHORECTOMY Right    during Diverticular surgery   Family History  Problem Relation Age of Onset   Heart disease Father 75   Thyroid disease Mother    Diabetes Mother        developed in her 102's   Dementia Mother        mini strokes   Heart disease Maternal  Grandmother    Prostate cancer Maternal Grandfather    Heart disease Paternal Grandmother    Heart disease Paternal Grandfather    Thyroid disease Sister    Colon cancer Neg Hx    Esophageal cancer Neg Hx    Stomach cancer Neg Hx    Pancreatic cancer Neg Hx    Rectal cancer Neg Hx    Social History   Socioeconomic History   Marital status: Married    Spouse name: JD   Number of children: 1   Years of education: college   Highest education level: Not on file  Occupational History   Occupation: Web designer at [employer] Use   Smoking status: Never Smoker   Smokeless tobacco: Never Used  Scientific laboratory technician Use: Never used  Substance and Sexual Activity   Alcohol use: Yes    Alcohol/week: 0.0 standard drinks    Comment: 3-4 times a week, 1 drink   Drug use: No   Sexual activity: Yes    Birth control/protection: Post-menopausal  Other Topics Concern   Not on file  Social History Narrative   04/18/20   From: [place], [place] originally, moved to the [employer] in middle school   Living: with [place], husband (1982)    Work: semi-retired - Human resources officer spanish       Family: Cecilie Lowers - in [place] - 2 grandchildren and step son Saralyn Pilar and 3 step grandchildren in [place]      Enjoys: active with her church - serves on sundays, bible studies, yard work, reading      Exercise: going to [employer], exercising with virtual class   Diet: tries to eat healthy, drink more water      Safety   Seat belts: Yes    Guns: Yes  and secure   Safe in relationships: Yes    Social Determinants of Radio broadcast assistant Strain: Low Risk    Difficulty of Paying Living Expenses: Not hard at all  Food Insecurity: No Food Insecurity   Worried About Charity fundraiser in the Last Year: Never true   Arboriculturist in the Last Year: Never true  Transportation Needs: No Transportation Needs   Lack of Transportation (Medical): No   Lack of Transportation  (Non-Medical): No  Physical Activity: Sufficiently Active   Days of Exercise per Week: 3 days   Minutes of Exercise per Session: 60 min  Stress: No Stress Concern Present   Feeling of Stress : Not at all  Social Connections:    Frequency of Communication with Friends and Family: Not on file   Frequency of Social Gatherings with Friends and Family: Not on file   Attends Religious Services: Not on file   Active Member of Clubs or Organizations: Not on file  Attends Archivist Meetings: Not on file   Marital Status: Not on file    Tobacco Counseling Counseling given: Not Answered   Clinical Intake:  Pre-visit preparation completed: Yes  Pain : No/denies pain     Nutritional Risks: None Diabetes: No  How often do you need to have someone help you when you read instructions, pamphlets, or other written materials from your doctor or pharmacy?: 1 - Never What is the last grade level you completed in school?: college graduate  Diabetic: No Nutrition Risk Assessment:  Has the patient had any N/V/D within the last 2 months?  No  Does the patient have any non-healing wounds?  No  Has the patient had any unintentional weight loss or weight gain?  No   Diabetes:  Is the patient diabetic?  No  If diabetic, was a CBG obtained today?  N/A Did the patient bring in their glucometer from home?  N/A How often do you monitor your CBG's? N/A.   Financial Strains and Diabetes Management:  Are you having any financial strains with the device, your supplies or your medication? N/A.  Does the patient want to be seen by Chronic Care Management for management of their diabetes?  N/A Would the patient like to be referred to a Nutritionist or for Diabetic Management?  N/A     Interpreter Needed?: No  Information entered by :: CJohnson, LPN   Activities of Daily Living In your present state of health, do you have any difficulty performing the following activities:  06/03/2020 12/10/2019  Hearing? Y N  Comment slight hearing loss noted -  Vision? N N  Difficulty concentrating or making decisions? N N  Walking or climbing stairs? N N  Dressing or bathing? N N  Doing errands, shopping? N N  Preparing Food and eating ? N -  Using the Toilet? N -  In the past six months, have you accidently leaked urine? N -  Do you have problems with loss of bowel control? N -  Managing your Medications? N -  Managing your Finances? N -  Housekeeping or managing your Housekeeping? N -  Some recent data might be hidden    Patient Care Team: [name], MD as PCP - General (Family Medicine) Nicole Kindred, MD as Referring Physician (Orthopedic Surgery)  Indicate any recent Medical Services you may have received from other than [facility] providers in the past year (date may be approximate).     Assessment:   This is a routine wellness examination for [place].  Hearing/Vision screen  Hearing Screening   125Hz  250Hz  500Hz  1000Hz  2000Hz  3000Hz  4000Hz  6000Hz  8000Hz   Right ear:           Left ear:           Vision Screening Comments: Patient gets annual eye exams   Dietary issues and exercise activities discussed: Current Exercise Habits: Structured exercise class, Type of exercise: strength training/weights;walking, Time (Minutes): 60, Frequency (Times/Week): 3, Weekly Exercise (Minutes/Week): 180, Intensity: Moderate, Exercise limited by: None identified  Goals     Increase physical activity     * Exercise program at home      Patient Stated     06/03/2020, I will continue to walk my dog daily for about 30 minutes. And I do Silver Sneakers 2 days a week for about 30 minutes online.       Depression Screen PHQ 2/9 Scores 06/03/2020 04/18/2020 12/16/2019 10/21/2018 10/17/2017 10/12/2016 03/30/2016  PHQ -  2 Score 0 0 0 0 0 0 0  PHQ- 9 Score 0 - - - - - -    Fall Risk Fall Risk  06/03/2020 04/18/2020 12/16/2019 10/21/2018 10/17/2017  Falls in the past year? 1 1 0 0  No  Comment fell while walking on the trial - - - -  Number falls in past yr: 0 0 0 - -  Comment - - - - -  Injury with Fall? 0 0 0 - -  Risk for fall due to : No Fall Risks - - - -  Follow up Falls evaluation completed;Falls prevention discussed - Falls evaluation completed - -    Any stairs in or around the home? Yes  If so, are there any without handrails? No  Home free of loose throw rugs in walkways, pet beds, electrical cords, etc? Yes  Adequate lighting in your home to reduce risk of falls? Yes   ASSISTIVE DEVICES UTILIZED TO PREVENT FALLS:  Life alert? No  Use of a cane, walker or w/c? No  Grab bars in the bathroom? No  Shower chair or bench in shower? No  Elevated toilet seat or a handicapped toilet? No   TIMED UP AND GO:  Was the test performed? N/A, telephone visit .  Cognitive Function: MMSE - Mini Mental State Exam 06/03/2020 03/25/2015  Orientation to time 5 5  Orientation to Place 5 5  Registration 3 3  Attention/ Calculation 5 5  Recall 3 3  Language- name 2 objects - 2  Language- repeat 1 1  Language- follow 3 step command - 3  Language- read & follow direction - 1  Write a sentence - 1  Copy design - 1  Total score - 30  Mini Cog  Mini-Cog screen was completed. Maximum score is 22. A value of 0 denotes this part of the MMSE was not completed or the patient failed this part of the Mini-Cog screening.       Immunizations Immunization History  Administered Date(s) Administered   Fluad Quad(high Dose 65+) 04/18/2020   Influenza, High Dose Seasonal PF 03/25/2015, 03/30/2016   Influenza-Unspecified 04/02/2019   Pneumococcal Conjugate-13 02/26/2014   Pneumococcal Polysaccharide-23 03/30/2016   Tdap 02/26/2014   Zoster 08/27/2011    TDAP status: Up to date Flu Vaccine status: Up to date Pneumococcal vaccine status: Up to date Covid-19 vaccine status: Declined, Education has been provided regarding the importance of this vaccine but patient  still declined. Advised may receive this vaccine at local pharmacy or Health Dept.or vaccine clinic. Aware to provide a copy of the vaccination record if obtained from local pharmacy or Health Dept. Verbalized acceptance and understanding.  Qualifies for Shingles Vaccine? Yes   Zostavax completed Yes   Shingrix Completed?: No.    Education has been provided regarding the importance of this vaccine. Patient has been advised to call insurance company to determine out of pocket expense if they have not yet received this vaccine. Advised may also receive vaccine at local pharmacy or Health Dept. Verbalized acceptance and understanding.  Screening Tests Health Maintenance  Topic Date Due   COVID-19 Vaccine (1) 06/19/2020 (Originally 09/28/1957)   MAMMOGRAM  01/21/2022   TETANUS/TDAP  02/27/2024   COLONOSCOPY  03/03/2025   INFLUENZA VACCINE  Completed   DEXA SCAN  Completed   Hepatitis C Screening  Completed   PNA vac Low Risk Adult  Completed    Health Maintenance  There are no preventive care reminders to display for this  patient.  Colorectal cancer screening: Completed 03/03/2020. Repeat every 5 years Mammogram status: Completed 01/22/2020. Repeat every year Bone Density status: due, will discuss with provider and call/schedule appointment  Lung Cancer Screening: (Low Dose CT Chest recommended if Age 55-80 years, 30 pack-year currently smoking OR have quit w/in 15years.) does not qualify.   Additional Screening:  Hepatitis C Screening: does qualify; Completed 10/12/2016  Vision Screening: Recommended annual ophthalmology exams for early detection of glaucoma and other disorders of the eye. Is the patient up to date with their annual eye exam?  Yes  Who is the provider or what is the name of the office in which the patient attends annual eye exams? [facility]  If pt is not established with a provider, would they like to be referred to a provider to establish care? No .     Dental Screening: Recommended annual dental exams for proper oral hygiene  Community Resource Referral / Chronic Care Management: CRR required this visit?  No   CCM required this visit?  No      Plan:     I have personally reviewed and noted the following in the patients chart:    Medical and social history  Use of alcohol, tobacco or illicit drugs   Current medications and supplements  Functional ability and status  Nutritional status  Physical activity  Advanced directives  List of other physicians  Hospitalizations, surgeries, and ER visits in previous 12 months  Vitals  Screenings to include cognitive, depression, and falls  Referrals and appointments  In addition, I have reviewed and discussed with patient certain preventive protocols, quality metrics, and best practice recommendations. A written personalized care plan for preventive services as well as general preventive health recommendations were provided to patient.   Due to this being a telephonic visit, the after visit summary with patients personalized plan was offered to patient via office or my-chart. Patient preferred to pick up at office at next visit or via mychart.   Andrez Grime, LPN   [id number]/08/[id number]

## 2020-06-03 NOTE — Patient Instructions (Signed)
Catherine Prince , Thank you for taking time to come for your Medicare Wellness Visit. I appreciate your ongoing commitment to your health goals. Please review the following plan we discussed and let me know if I can assist you in the future.   Screening recommendations/referrals: Colonoscopy: Up to date, completed 03/03/2020, due 03/2025 Mammogram: Up to date, completed 01/22/2020, due 12/2020 Bone Density: due, will discuss with provider and call/schedule appointment  Recommended yearly ophthalmology/optometry visit for glaucoma screening and checkup Recommended yearly dental visit for hygiene and checkup  Vaccinations: Influenza vaccine: Up to date, completed 04/18/2020, due 01/2021 Pneumococcal vaccine: Completed series Tdap vaccine: Up to date, completed 02/26/2014, due 01/2024 Shingles vaccine: due, check with your insurance regarding coverage if interested    Covid-19:declined  Advanced directives: Please bring a copy of your POA (Power of [place]) and/or Living Will to your next appointment.   Conditions/risks identified: hyperlipidemia  Next appointment: Follow up in one year for your annual wellness visit    Preventive Care 65 Years and Older, Female Preventive care refers to lifestyle choices and visits with your health care provider that can promote health and wellness. What does preventive care include?  A yearly physical exam. This is also called an annual well check.  Dental exams once or twice a year.  Routine eye exams. Ask your health care provider how often you should have your eyes checked.  Personal lifestyle choices, including:  Daily care of your teeth and gums.  Regular physical activity.  Eating a healthy diet.  Avoiding tobacco and drug use.  Limiting alcohol use.  Practicing safe sex.  Taking low-dose aspirin every day.  Taking vitamin and mineral supplements as recommended by your health care provider. What happens during an annual well check? The  services and screenings done by your health care provider during your annual well check will depend on your age, overall health, lifestyle risk factors, and family history of disease. Counseling  Your health care provider may ask you questions about your:  Alcohol use.  Tobacco use.  Drug use.  Emotional well-being.  Home and relationship well-being.  Sexual activity.  Eating habits.  History of falls.  Memory and ability to understand (cognition).  Work and work Statistician.  Reproductive health. Screening  You may have the following tests or measurements:  Height, weight, and BMI.  Blood pressure.  Lipid and cholesterol levels. These may be checked every 5 years, or more frequently if you are over 71 years old.  Skin check.  Lung cancer screening. You may have this screening every year starting at age 56 if you have a 30-pack-year history of smoking and currently smoke or have quit within the past 15 years.  Fecal occult blood test (FOBT) of the stool. You may have this test every year starting at age 47.  Flexible sigmoidoscopy or colonoscopy. You may have a sigmoidoscopy every 5 years or a colonoscopy every 10 years starting at age 57.  Hepatitis C blood test.  Hepatitis B blood test.  Sexually transmitted disease (STD) testing.  Diabetes screening. This is done by checking your blood sugar (glucose) after you have not eaten for a while (fasting). You may have this done every 1-3 years.  Bone density scan. This is done to screen for osteoporosis. You may have this done starting at age 79.  Mammogram. This may be done every 1-2 years. Talk to your health care provider about how often you should have regular mammograms. Talk with your health care  provider about your test results, treatment options, and if necessary, the need for more tests. Vaccines  Your health care provider may recommend certain vaccines, such as:  Influenza vaccine. This is recommended  every year.  Tetanus, diphtheria, and acellular pertussis (Tdap, Td) vaccine. You may need a Td booster every 10 years.  Zoster vaccine. You may need this after age 73.  Pneumococcal 13-valent conjugate (PCV13) vaccine. One dose is recommended after age 7.  Pneumococcal polysaccharide (PPSV23) vaccine. One dose is recommended after age 28. Talk to your health care provider about which screenings and vaccines you need and how often you need them. This information is not intended to replace advice given to you by your health care provider. Make sure you discuss any questions you have with your health care provider. Document Released: 07/15/2015 Document Revised: 03/07/2016 Document Reviewed: 04/19/2015 Elsevier Interactive Patient Education  2017 [place] Prevention in the Home Falls can cause injuries. They can happen to people of all ages. There are many things you can do to make your home safe and to help prevent falls. What can I do on the outside of my home?  Regularly fix the edges of walkways and driveways and fix any cracks.  Remove anything that might make you trip as you walk through a door, such as a raised step or threshold.  Trim any bushes or trees on the path to your home.  Use bright outdoor lighting.  Clear any walking paths of anything that might make someone trip, such as rocks or tools.  Regularly check to see if handrails are loose or broken. Make sure that both sides of any steps have handrails.  Any raised decks and porches should have guardrails on the edges.  Have any leaves, snow, or ice cleared regularly.  Use sand or salt on walking paths during winter.  Clean up any spills in your garage right away. This includes oil or grease spills. What can I do in the bathroom?  Use night lights.  Install grab bars by the toilet and in the tub and shower. Do not use towel bars as grab bars.  Use non-skid mats or decals in the tub or shower.  If  you need to sit down in the shower, use a plastic, non-slip stool.  Keep the floor dry. Clean up any water that spills on the floor as soon as it happens.  Remove soap buildup in the tub or shower regularly.  Attach bath mats securely with double-sided non-slip rug tape.  Do not have throw rugs and other things on the floor that can make you trip. What can I do in the bedroom?  Use night lights.  Make sure that you have a light by your bed that is easy to reach.  Do not use any sheets or blankets that are too big for your bed. They should not hang down onto the floor.  Have a firm chair that has side arms. You can use this for support while you get dressed.  Do not have throw rugs and other things on the floor that can make you trip. What can I do in the kitchen?  Clean up any spills right away.  Avoid walking on wet floors.  Keep items that you use a lot in easy-to-reach places.  If you need to reach something above you, use a strong step stool that has a grab bar.  Keep electrical cords out of the way.  Do not use floor polish  or wax that makes floors slippery. If you must use wax, use non-skid floor wax.  Do not have throw rugs and other things on the floor that can make you trip. What can I do with my stairs?  Do not leave any items on the stairs.  Make sure that there are handrails on both sides of the stairs and use them. Fix handrails that are broken or loose. Make sure that handrails are as long as the stairways.  Check any carpeting to make sure that it is firmly attached to the stairs. Fix any carpet that is loose or worn.  Avoid having throw rugs at the top or bottom of the stairs. If you do have throw rugs, attach them to the floor with carpet tape.  Make sure that you have a light switch at the top of the stairs and the bottom of the stairs. If you do not have them, ask someone to add them for you. What else can I do to help prevent falls?  Wear shoes  that:  Do not have high heels.  Have rubber bottoms.  Are comfortable and fit you well.  Are closed at the toe. Do not wear sandals.  If you use a stepladder:  Make sure that it is fully opened. Do not climb a closed stepladder.  Make sure that both sides of the stepladder are locked into place.  Ask someone to hold it for you, if possible.  Clearly mark and make sure that you can see:  Any grab bars or handrails.  First and last steps.  Where the edge of each step is.  Use tools that help you move around (mobility aids) if they are needed. These include:  Canes.  Walkers.  Scooters.  Crutches.  Turn on the lights when you go into a dark area. Replace any light bulbs as soon as they burn out.  Set up your furniture so you have a clear path. Avoid moving your furniture around.  If any of your floors are uneven, fix them.  If there are any pets around you, be aware of where they are.  Review your medicines with your doctor. Some medicines can make you feel dizzy. This can increase your chance of falling. Ask your doctor what other things that you can do to help prevent falls. This information is not intended to replace advice given to you by your health care provider. Make sure you discuss any questions you have with your health care provider. Document Released: 04/14/2009 Document Revised: 11/24/2015 Document Reviewed: 07/23/2014 Elsevier Interactive Patient Education  2017 [employer].

## 2020-06-03 NOTE — Progress Notes (Signed)
PCP notes:  Health Maintenance: Covid- declined Dexa- due   Abnormal Screenings: none   Patient concerns: Discuss podiatry referral for feet issues   Nurse concerns: none   Next PCP appt.: 06/06/2020 @ 11:40 am

## 2020-06-06 ENCOUNTER — Encounter: Payer: Medicare HMO | Admitting: Family Medicine

## 2020-06-14 ENCOUNTER — Ambulatory Visit: Payer: Medicare HMO

## 2020-06-14 DIAGNOSIS — H524 Presbyopia: Secondary | ICD-10-CM | POA: Diagnosis not present

## 2020-06-15 DIAGNOSIS — R69 Illness, unspecified: Secondary | ICD-10-CM | POA: Diagnosis not present

## 2020-06-16 ENCOUNTER — Ambulatory Visit (INDEPENDENT_AMBULATORY_CARE_PROVIDER_SITE_OTHER): Payer: Medicare HMO | Admitting: Family Medicine

## 2020-06-16 ENCOUNTER — Other Ambulatory Visit: Payer: Self-pay

## 2020-06-16 ENCOUNTER — Encounter: Payer: Self-pay | Admitting: Family Medicine

## 2020-06-16 VITALS — BP 112/78 | HR 69 | Temp 97.6°F | Ht 61.0 in | Wt 138.8 lb

## 2020-06-16 DIAGNOSIS — R7303 Prediabetes: Secondary | ICD-10-CM

## 2020-06-16 DIAGNOSIS — E782 Mixed hyperlipidemia: Secondary | ICD-10-CM | POA: Diagnosis not present

## 2020-06-16 DIAGNOSIS — Z8349 Family history of other endocrine, nutritional and metabolic diseases: Secondary | ICD-10-CM

## 2020-06-16 DIAGNOSIS — M8580 Other specified disorders of bone density and structure, unspecified site: Secondary | ICD-10-CM

## 2020-06-16 DIAGNOSIS — N1831 Chronic kidney disease, stage 3a: Secondary | ICD-10-CM

## 2020-06-16 DIAGNOSIS — Z0000 Encounter for general adult medical examination without abnormal findings: Secondary | ICD-10-CM

## 2020-06-16 DIAGNOSIS — M899 Disorder of bone, unspecified: Secondary | ICD-10-CM

## 2020-06-16 DIAGNOSIS — N183 Chronic kidney disease, stage 3 unspecified: Secondary | ICD-10-CM | POA: Insufficient documentation

## 2020-06-16 NOTE — Progress Notes (Signed)
Annual Exam   Chief Complaint:  Chief Complaint  Patient presents with  . Medicare Wellness    History of Present Illness:  [name] is a 74 y.o. No obstetric history on file. who LMP was No LMP recorded. Patient is postmenopausal., presents today for her annual examination.     Nutrition She does get adequate calcium and Vitamin D in her diet. Diet: well rounded diet, eats dairy  Exercise: silver sneakers online, walking daily - 30-45 minutes  Safety The patient wears seatbelts: yes.     The patient feels safe at home and in their relationships: yes.  Dentist - yes Eye doctor - yes  Menstrual:  Post menopausal  GYN She is single partner, contraception - post menopausal status.    Breast Cancer Screening (Age 78-74):  There is no FH of breast cancer. There is no FH of ovarian cancer. BRCA screening Not Indicated.  Last Mammogram: 01/22/2020 The patient does want a mammogram this year.    Colon Cancer Screening:  Age 62-75 yo - benefits outweigh the risk. Adults 12-85 yo who have never been screened benefit.  Benefits: 134000 people in 2016 will be diagnosed and 49,000 will die - early detection helps [place]: Complications 2/2 to colonoscopy High Risk (Colonoscopy): genetic disorder (Lynch syndrome or familial adenomatous polyposis), personal hx of IBD, previous adenomatous polyp, or previous colorectal cancer, FamHx start 10 years before the age at diagnosis, increased in males and black race  Options:  FIT - looks for hemoglobin (blood in the stool) - specific and fairly sensitive - must be done annually Cologuard - looks for DNA and blood - more sensitive - therefore can have more false positives, every 3 years Colonoscopy - every 10 years if normal - sedation, bowl prep, must have someone drive you  Shared decision making and the patient had decided to do colonoscopy up to date.   Social History   Tobacco Use  Smoking Status Never Smoker  Smokeless  Tobacco Never Used    Lung Cancer Screening (Ages 33-29): not applicable   Weight Wt Readings from Last 3 Encounters:  06/16/20 138 lb 12.8 oz (63 kg)  04/18/20 137 lb 12 oz (62.5 kg)  03/03/20 135 lb (61.2 kg)   Patient has high BMI  BMI Readings from Last 1 Encounters:  06/16/20 26.23 kg/m     Chronic disease screening Blood pressure monitoring:  BP Readings from Last 3 Encounters:  06/16/20 112/78  04/18/20 110/78  03/03/20 106/64    Lipid Monitoring: Indication for screening: age >32, obesity, diabetes, family hx, CV risk factors.  Lipid screening: Yes  Lab Results  Component Value Date   CHOL 242 (H) 01/29/2019   HDL 77.60 01/29/2019   LDLCALC 149 (H) 01/29/2019   TRIG 80.0 01/29/2019   CHOLHDL 3 01/29/2019     Diabetes Screening: age >71, overweight, family hx, PCOS, hx of gestational diabetes, at risk ethnicity Diabetes Screening screening: Yes  Lab Results  Component Value Date   HGBA1C 5.8 01/29/2019     Past Medical History:  Diagnosis Date  . Allergy    mild seasonal allergies  . Arthritis   . 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 Cataract    Left eye  . Diverticulitis   . Diverticulosis of colon without hemorrhage 12/09/2014  . H/O diverticulitis of colon 12/09/2014   7.5 inches removed after rupture in 2010, has done well since Also rook appendix and 1 ovary  .  Heart murmur   . History of chicken pox   . Impaired fasting glucose 12/09/2014  . Macular hole of left eye   . Measles   . Mumps   . Nocturia 12/09/2014  . Osteoarthritis 12/09/2014  . Osteopenia 04/01/2015  . Personal history of cardiac arrhythmia 03/30/2016  . SBO (small bowel obstruction) ([place]) 01/04/2018  . Vitamin D deficiency     Past Surgical History:  Procedure Laterality Date  . APPENDECTOMY    . BLEPHAROPLASTY Bilateral 08/02/2017  . BREAST BIOPSY    . BREAST CYST ASPIRATION    . CATARACT EXTRACTION Left   . COLON SURGERY  2010   pt. reports intestines burst  .  COLONOSCOPY  03/03/2020  . EYE SURGERY     Left eye, retinal hole, Dr Manson Allan, at [facility] in [place] Left    09/2013  . REPLACEMENT TOTAL KNEE Right 2016  . RIGHT OOPHORECTOMY Right    during Diverticular surgery    Prior to Admission medications   Medication Sig Start Date End Date Taking? Authorizing Provider  acetaminophen (TYLENOL) 500 MG tablet Take 500 mg by mouth every 4 (four) hours as needed for mild pain.    Yes [provider]  BIOTIN PO Take by mouth.   Yes [provider]  Calcium Polycarbophil (FIBER-CAPS PO) Take by mouth.   Yes [provider]  Multiple Vitamin (MULTIVITAMIN) capsule Take 1 capsule by mouth daily.   Yes [provider]  Probiotic Product (DIGESTIVE ADVANTAGE PO) Take by mouth.   Yes [provider]  VITAMIN D PO Take 4,000 Units by mouth.   Yes [provider]  ZINC PICOLINATE PO Take by mouth as needed.   Yes [provider]    No Known Allergies  Gynecologic History: No LMP recorded. Patient is postmenopausal.  Obstetric History: No obstetric history on file.  Social History   Socioeconomic History  . Marital status: Married    Spouse name: JD  . Number of children: 1  . Years of education: college  . Highest education level: Not on file  Occupational History  . Occupation: Web designer at [employer]  . Smoking status: Never Smoker  . Smokeless tobacco: Never Used  Vaping Use  . Vaping Use: Never used  Substance and Sexual Activity  . Alcohol use: Yes    Alcohol/week: 0.0 standard drinks    Comment: 3-4 times a week, 1 drink  . Drug use: No  . Sexual activity: Yes    Birth control/protection: Post-menopausal  Other Topics Concern  . Not on file  Social History Narrative   04/18/20   From: [place], [place] originally, moved to the [employer] in middle school   Living: with JD, husband (1982)    Work: semi-retired -  Human resources officer spanish       Family: Cecilie Lowers - in [place] - 2 grandchildren and step son Saralyn Pilar and 3 step grandchildren in [place]      Enjoys: active with her church - serves on sundays, bible studies, yard work, reading      Exercise: going to [employer], exercising with virtual class   Diet: tries to eat healthy, drink more water      Safety   Seat belts: Yes    Guns: Yes  and secure   Safe in relationships: Yes    Social Determinants of Health   Financial Resource Strain: Low Risk   . Difficulty of Paying Living  Expenses: Not hard at all  Food Insecurity: No Food Insecurity  . Worried About Charity fundraiser in the Last Year: Never true  . Ran Out of Food in the Last Year: Never true  Transportation Needs: No Transportation Needs  . Lack of Transportation (Medical): No  . Lack of Transportation (Non-Medical): No  Physical Activity: Sufficiently Active  . Days of Exercise per Week: 3 days  . Minutes of Exercise per Session: 60 min  Stress: No Stress Concern Present  . Feeling of Stress : Not at all  Social Connections: Not on file  Intimate Partner Violence: Not At Risk  . Fear of Current or Ex-Partner: No  . Emotionally Abused: No  . Physically Abused: No  . Sexually Abused: No    Family History  Problem Relation Age of Onset  . Heart disease Father 36  . Thyroid disease Mother   . Diabetes Mother        developed in her 63's  . Dementia Mother        mini strokes  . Heart disease Maternal Grandmother   . Prostate cancer Maternal Grandfather   . Heart disease Paternal Grandmother   . Heart disease Paternal Grandfather   . Thyroid disease Sister   . Colon cancer Neg Hx   . Esophageal cancer Neg Hx   . Stomach cancer Neg Hx   . Pancreatic cancer Neg Hx   . Rectal cancer Neg Hx     Review of Systems  Constitutional: Negative for chills and fever.  HENT: Negative for congestion and sore throat.   Eyes: Negative for blurred vision and double vision.  Respiratory: Negative  for shortness of breath.   Cardiovascular: Negative for chest pain.  Gastrointestinal: Negative for heartburn, nausea and vomiting.  Genitourinary: Negative.   Musculoskeletal: Negative.  Negative for myalgias.  Skin: Negative for rash.  Neurological: Negative for dizziness and headaches.  Endo/Heme/Allergies: Does not bruise/bleed easily.  Psychiatric/Behavioral: Negative for depression. The patient is not nervous/anxious.      Physical Exam BP 112/78   Pulse 69   Temp 97.6 F (36.4 C) (Temporal)   Ht '5\' 1"'  (1.549 m)   Wt 138 lb 12.8 oz (63 kg)   SpO2 95%   BMI 26.23 kg/m    BP Readings from Last 3 Encounters:  06/16/20 112/78  04/18/20 110/78  03/03/20 106/64      Physical Exam Constitutional:      General: She is not in acute distress.    Appearance: She is well-developed and well-nourished. She is not diaphoretic.  HENT:     Head: Normocephalic and atraumatic.     Right Ear: External ear normal.     Left Ear: External ear normal.     Nose: Nose normal.     Mouth/Throat:     Mouth: Oropharynx is clear and moist.  Eyes:     General: No scleral icterus.    Extraocular Movements: EOM normal.     Conjunctiva/sclera: Conjunctivae normal.  Cardiovascular:     Rate and Rhythm: Normal rate and regular rhythm.     Heart sounds: No murmur heard.   Pulmonary:     Effort: Pulmonary effort is normal. No respiratory distress.     Breath sounds: Normal breath sounds. No wheezing.  Abdominal:     General: Bowel sounds are normal. There is no distension.     Palpations: Abdomen is soft. There is no mass.     Tenderness: There is no abdominal  tenderness. There is no guarding or rebound.  Musculoskeletal:        General: No edema. Normal range of motion.     Cervical back: Neck supple.  Lymphadenopathy:     Cervical: No cervical adenopathy.  Skin:    General: Skin is warm and dry.     Capillary Refill: Capillary refill takes less than 2 seconds.  Neurological:      Mental Status: She is alert and oriented to person, place, and time.     Deep Tendon Reflexes: Strength normal. Reflexes normal.  Psychiatric:        Mood and Affect: Mood and affect normal.        Behavior: Behavior normal.     Results:  PHQ-9:  Flowsheet Row Clinical Support from 06/03/2020 in [place] at [facility]  PHQ-9 Total Score 0        Assessment: 74 y.o. No obstetric history on file. female here for routine annual physical examination.  Plan: Problem List Items Addressed This Visit      Musculoskeletal and Integument   Osteopenia   Relevant Orders   DG Bone Density     Genitourinary   CKD (chronic kidney disease) stage 3, GFR 30-59 ml/min (HCC)   Relevant Orders   Basic metabolic panel     Other   Hyperlipidemia   Relevant Orders   Lipid panel   Prediabetes   Relevant Orders   Hemoglobin A1c   Lesion of bone of foot    Placing new referral for lesion on top of foot.       Relevant Orders   Ambulatory referral to Podiatry    Other Visit Diagnoses    Annual physical exam    -  Primary   Family history of thyroid disease       Relevant Orders   TSH      Screening: -- Blood pressure screen normal -- cholesterol screening: elevated - discussed candidate for statin and she declined -- Weight screening: normal -- Diabetes Screening: will obtain -- Nutrition: Encouraged healthy diet  The 10-year ASCVD risk score Mikey Bussing DC Jr., et al., 2013) is: 10.3%   Values used to calculate the score:     Age: 63 years     Sex: Female     Is Non-Hispanic African American: No     Diabetic: No     Tobacco smoker: No     Systolic Blood Pressure: 737 mmHg     Is BP treated: No     HDL Cholesterol: 77.6 mg/dL     Total Cholesterol: 242 mg/dL  -- Statin therapy for Age 72-75 with CVD risk >7.5%  Psych -- Depression screening (PHQ-9):  Flowsheet Row Clinical Support from 06/03/2020 in [place] at [facility]  PHQ-9 Total Score 0        Safety -- tobacco screening: not using -- alcohol screening:  low-risk usage. -- no evidence of domestic violence or intimate partner violence.   Cancer Screening -- family history of breast cancer screening: done. not at high risk. -- Mammogram - up to date -- Colon cancer (age 74+)-- up to date  Immunizations Immunization History  Administered Date(s) Administered  . Fluad Quad(high Dose 65+) 04/18/2020  . Influenza, High Dose Seasonal PF 03/25/2015, 03/30/2016  . Influenza-Unspecified 04/02/2019  . Pneumococcal Conjugate-13 02/26/2014  . Pneumococcal Polysaccharide-23 03/30/2016  . Tdap 02/26/2014  . Zoster 08/27/2011    -- flu vaccine up to date -- TDAP q10 years up to  date -- Shingles (age >64) - she will check with pharmacy -- PPSV-23 (19-64 with chronic disease or smoking) up to date -- PCV-13 (age >13) - one dose followed by PPSV-23 1 year later up to date -- Covid-19 Vaccine declined   Encouraged healthy diet and exercise. Encouraged regular vision and dental care.    [name], MD

## 2020-06-16 NOTE — Patient Instructions (Addendum)
Please call the location of your choice from the menu below to schedule your Mammogram and/or Bone Density appointment.     [place]  [address] [place] at [facility]   Phone:  [phone]   [address] [place], [place] [address]                                            Services: 3D Mammogram and Bone Density  Check with your pharmacy about the Shingles Vaccine  #Referral I have placed a referral to a specialist for you. You should receive a phone call from the specialty office. Make sure your voicemail is not full and that if you are able to answer your phone to unknown or new numbers.   It may take up to 2 weeks to hear about the referral. If you do not hear anything in 2 weeks, please call our office and ask to speak with the referral coordinator.

## 2020-06-16 NOTE — Assessment & Plan Note (Signed)
Placing new referral for lesion on top of foot.

## 2020-06-17 ENCOUNTER — Other Ambulatory Visit: Payer: Self-pay | Admitting: Family Medicine

## 2020-06-17 DIAGNOSIS — N1832 Chronic kidney disease, stage 3b: Secondary | ICD-10-CM

## 2020-06-17 LAB — BASIC METABOLIC PANEL
BUN: 32 mg/dL — ABNORMAL HIGH (ref 6–23)
CO2: 28 mEq/L (ref 19–32)
Calcium: 9.3 mg/dL (ref 8.4–10.5)
Chloride: 103 mEq/L (ref 96–112)
Creatinine, Ser: 1.43 mg/dL — ABNORMAL HIGH (ref 0.40–1.20)
GFR: 36.33 mL/min — ABNORMAL LOW (ref >60.00)
Glucose, Bld: 90 mg/dL (ref 70–99)
Potassium: 5 mEq/L (ref 3.5–5.1)
Sodium: 138 mEq/L (ref 135–145)

## 2020-06-17 LAB — LIPID PANEL
Cholesterol: 226 mg/dL — ABNORMAL HIGH (ref 0–200)
HDL: 82.2 mg/dL (ref >39.00)
LDL Cholesterol: 120 mg/dL — ABNORMAL HIGH (ref 0–99)
NonHDL: 144.17
Total CHOL/HDL Ratio: 3
Triglycerides: 121 mg/dL (ref 0.0–149.0)
VLDL: 24.2 mg/dL (ref 0.0–40.0)

## 2020-06-17 LAB — TSH: TSH: 2.88 u[IU]/mL (ref 0.35–4.50)

## 2020-06-17 LAB — HEMOGLOBIN A1C: Hgb A1c MFr Bld: 5.8 % (ref 4.6–6.5)

## 2020-06-17 NOTE — Addendum Note (Signed)
Addended by: Cloyd Stagers on: 06/17/2020 02:10 PM   Modules accepted: Orders

## 2020-06-20 ENCOUNTER — Other Ambulatory Visit (INDEPENDENT_AMBULATORY_CARE_PROVIDER_SITE_OTHER): Payer: Medicare HMO

## 2020-06-20 ENCOUNTER — Other Ambulatory Visit: Payer: Self-pay

## 2020-06-20 DIAGNOSIS — R7303 Prediabetes: Secondary | ICD-10-CM | POA: Diagnosis not present

## 2020-06-21 ENCOUNTER — Encounter: Payer: Self-pay | Admitting: Family Medicine

## 2020-06-21 ENCOUNTER — Other Ambulatory Visit: Payer: Self-pay | Admitting: Family Medicine

## 2020-06-21 DIAGNOSIS — N1832 Chronic kidney disease, stage 3b: Secondary | ICD-10-CM

## 2020-06-21 LAB — MICROALBUMIN / CREATININE URINE RATIO
Creatinine,U: 29.6 mg/dL
Microalb Creat Ratio: 2.4 mg/g (ref 0.0–30.0)
Microalb, Ur: <0.7 mg/dL (ref 0.0–1.9)

## 2020-06-22 DIAGNOSIS — Z01818 Encounter for other preprocedural examination: Secondary | ICD-10-CM | POA: Diagnosis not present

## 2020-06-22 DIAGNOSIS — H43811 Vitreous degeneration, right eye: Secondary | ICD-10-CM | POA: Diagnosis not present

## 2020-06-29 ENCOUNTER — Ambulatory Visit: Payer: Medicare HMO | Admitting: Podiatry

## 2020-06-29 ENCOUNTER — Ambulatory Visit (INDEPENDENT_AMBULATORY_CARE_PROVIDER_SITE_OTHER): Payer: Medicare HMO

## 2020-06-29 DIAGNOSIS — M19072 Primary osteoarthritis, left ankle and foot: Secondary | ICD-10-CM | POA: Diagnosis not present

## 2020-06-29 DIAGNOSIS — M778 Other enthesopathies, not elsewhere classified: Secondary | ICD-10-CM | POA: Diagnosis not present

## 2020-06-29 DIAGNOSIS — M19079 Primary osteoarthritis, unspecified ankle and foot: Secondary | ICD-10-CM | POA: Diagnosis not present

## 2020-06-29 DIAGNOSIS — M899 Disorder of bone, unspecified: Secondary | ICD-10-CM

## 2020-06-29 NOTE — Progress Notes (Signed)
Subjective:  Patient ID: Catherine Prince, female    DOB: 11-07-1945,  MRN: [id number] HPI Chief Complaint  Patient presents with  . Foot Pain    Dorsal midfoot left - knot x few months, more tender when barefoot, better with shoes but lace-ups has to keep loose  . New Patient (Initial Visit)    74 y.o. female presents with the above complaint.   ROS: She denies fever chills nausea vomiting muscle aches pains calf pain back pain chest pain shortness of breath.  Past Medical History:  Diagnosis Date  . Allergy    mild seasonal allergies  . Arthritis   . Cancer (HCC)    precancerous on skin  . Cataract    Left eye  . Diverticulitis   . Diverticulosis of colon without hemorrhage 12/09/2014  . H/O diverticulitis of colon 12/09/2014   7.5 inches removed after rupture in 2010, has done well since Also rook appendix and 1 ovary  . Heart murmur   . History of chicken pox   . Impaired fasting glucose 12/09/2014  . Macular hole of left eye   . Measles   . Mumps   . Nocturia 12/09/2014  . Osteoarthritis 12/09/2014  . Osteopenia 04/01/2015  . Personal history of cardiac arrhythmia 03/30/2016  . SBO (small bowel obstruction) (HCC) 01/04/2018  . Vitamin D deficiency    Past Surgical History:  Procedure Laterality Date  . APPENDECTOMY    . BLEPHAROPLASTY Bilateral 08/02/2017  . BREAST BIOPSY    . BREAST CYST ASPIRATION    . CATARACT EXTRACTION Left   . COLON SURGERY  2010   pt. reports intestines burst  . COLONOSCOPY  03/03/2020  . EYE SURGERY     Left eye, retinal hole, Dr Dolores Hoose, at [facility] in [place]  . REPLACEMENT TOTAL KNEE Left    09/2013  . REPLACEMENT TOTAL KNEE Right 2016  . RIGHT OOPHORECTOMY Right    during Diverticular surgery    Current Outpatient Medications:  .  acetaminophen (TYLENOL) 500 MG tablet, Take 500 mg by mouth every 4 (four) hours as needed for mild pain. , Disp: , Rfl:  .  BIOTIN PO, Take by mouth., Disp: , Rfl:  .  Calcium Polycarbophil (FIBER-CAPS  PO), Take by mouth., Disp: , Rfl:  .  Multiple Vitamin (MULTIVITAMIN) capsule, Take 1 capsule by mouth daily., Disp: , Rfl:  .  ofloxacin (OCUFLOX) 0.3 % ophthalmic solution, , Disp: , Rfl:  .  prednisoLONE acetate (PRED FORTE) 1 % ophthalmic suspension, SMARTSIG:In Eye(s), Disp: , Rfl:  .  Probiotic Product (DIGESTIVE ADVANTAGE PO), Take by mouth., Disp: , Rfl:  .  VITAMIN D PO, Take 4,000 Units by mouth., Disp: , Rfl:  .  ZINC PICOLINATE PO, Take by mouth as needed., Disp: , Rfl:   No Known Allergies Review of Systems Objective:  There were no vitals filed for this visit.  General: Well developed, nourished, in no acute distress, alert and oriented x3   Dermatological: Skin is warm, dry and supple bilateral. Nails x 10 are well maintained; remaining integument appears unremarkable at this time. There are no open sores, no preulcerative lesions, no rash or signs of infection present.  Vascular: Dorsalis Pedis artery and Posterior Tibial artery pedal pulses are 2/4 bilateral with immedate capillary fill time. Pedal hair growth present. No varicosities and no lower extremity edema present bilateral.   Neruologic: Grossly intact via light touch bilateral. Vibratory intact via tuning fork bilateral. Protective threshold with Mathis Dad  monofilament intact to all pedal sites bilateral. Patellar and Achilles deep tendon reflexes 2+ bilateral. No Babinski or clonus noted bilateral.   Musculoskeletal: No gross boney pedal deformities bilateral. No pain, crepitus, or limitation noted with foot and ankle range of motion bilateral. Muscular strength 5/5 in all groups tested bilateral.  She has pain on palpation of the dorsal spur at the second metatarsal intermediate cuneiform joint.  Majority of her tenderness is when she is barefooted a slight arch seems to help the dorsum of the foot.  Gait: Unassisted, Nonantalgic.    Radiographs:  Radiographs taken today demonstrate an osseously mature  individual with mild osteopenia moderate pes planus.  Significant osteoarthritis of the midfoot forefoot rear foot.  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  Plan: Discussed options today as far as injection therapy which she declined we did discuss the use of arches in her shoes which she is going to do discussed the use of orthotics and topical anti-inflammatories.  She feels much better just knowing what it is.     Catherine Prince, [place]

## 2020-06-30 ENCOUNTER — Other Ambulatory Visit: Payer: Self-pay | Admitting: Podiatry

## 2020-06-30 DIAGNOSIS — M19072 Primary osteoarthritis, left ankle and foot: Secondary | ICD-10-CM

## 2020-07-08 ENCOUNTER — Encounter: Payer: Self-pay | Admitting: Family Medicine

## 2020-07-12 DIAGNOSIS — H43811 Vitreous degeneration, right eye: Secondary | ICD-10-CM | POA: Diagnosis not present

## 2020-07-12 DIAGNOSIS — H5789 Other specified disorders of eye and adnexa: Secondary | ICD-10-CM | POA: Diagnosis not present

## 2020-11-10 DIAGNOSIS — Z20822 Contact with and (suspected) exposure to covid-19: Secondary | ICD-10-CM | POA: Diagnosis not present

## 2020-12-26 ENCOUNTER — Other Ambulatory Visit: Payer: Self-pay | Admitting: Family Medicine

## 2020-12-26 ENCOUNTER — Encounter: Payer: Self-pay | Admitting: Family Medicine

## 2020-12-26 DIAGNOSIS — Z1231 Encounter for screening mammogram for malignant neoplasm of breast: Secondary | ICD-10-CM

## 2021-01-02 ENCOUNTER — Telehealth: Payer: Medicare HMO | Admitting: Nurse Practitioner

## 2021-01-02 DIAGNOSIS — R059 Cough, unspecified: Secondary | ICD-10-CM

## 2021-01-02 DIAGNOSIS — J0140 Acute pansinusitis, unspecified: Secondary | ICD-10-CM | POA: Diagnosis not present

## 2021-01-02 DIAGNOSIS — B370 Candidal stomatitis: Secondary | ICD-10-CM

## 2021-01-02 DIAGNOSIS — R69 Illness, unspecified: Secondary | ICD-10-CM | POA: Diagnosis not present

## 2021-01-02 MED ORDER — AMOXICILLIN-POT CLAVULANATE 875-125 MG PO TABS
1.0000 | ORAL_TABLET | Freq: Two times a day (BID) | ORAL | 0 refills | Status: DC
Start: 2021-01-02 — End: 2021-01-17

## 2021-01-02 MED ORDER — NYSTATIN 100000 UNIT/ML MT SUSP: 5.0000 mL | Freq: Four times a day (QID) | OROMUCOSAL | 0 refills | Status: AC

## 2021-01-02 NOTE — Progress Notes (Signed)
[name], Catherine Prince are scheduled for a virtual visit with your provider today.    Just as we do with appointments in the office, we must obtain your consent to participate.  Your consent will be active for this visit and any virtual visit you may have with one of our providers in the next 365 days.    If you have a MyChart account, I can also send a copy of this consent to you electronically.  All virtual visits are billed to your insurance company just like a traditional visit in the office.  As this is a virtual visit, video technology does not allow for your provider to perform a traditional examination.  This may limit your provider's ability to fully assess your condition.  If your provider identifies any concerns that need to be evaluated in person or the need to arrange testing such as labs, EKG, etc, we will make arrangements to do so.    Although advances in technology are sophisticated, we cannot ensure that it will always work on either your end or our end.  If the connection with a video visit is poor, we may have to switch to a telephone visit.  With either a video or telephone visit, we are not always able to ensure that we have a secure connection.   I need to obtain your verbal consent now.   Are you willing to proceed with your visit today?   Catherine Prince has provided verbal consent on 01/02/2021 for a virtual visit (video or telephone).   Apolonio Schneiders, FNP 01/02/2021  8:34 AM    Virtual Visit via Video   I connected with patient on 01/02/21 at  8:45 AM EDT by a video enabled telemedicine application and verified that I am speaking with the correct person using two identifiers.  Location patient: Home Location provider: [place] participating in the virtual visit: Patient, Provider  I discussed the limitations of evaluation and management by telemedicine and the availability of in person appointments. The patient expressed understanding and agreed to  proceed.  Subjective:   HPI:   Patient presents via Catherine Prince today with complaints of sinus congestion that has been bothering her for the past 2 weeks since she was at the beach. Two nights ago she felt that the drainage went to her chest. She now has a productive cough and ongoing nasal congestion.   Denies sore throat, body aches or fever.  Denies a history of asthma or any wheezing or shortness of breath    ROS:  +cough +productive cough   See pertinent positives and negatives per HPI.  Patient Active Problem List   Diagnosis Date Noted   CKD (chronic kidney disease) stage 3, GFR 30-59 ml/min ([place]) 06/16/2020   Failed hearing screening 04/18/2020   Lesion of bone of foot 04/18/2020   COVID-19 vaccination declined 04/18/2020   Cataract 02/01/2019   PVC's (premature ventricular contractions) 04/20/2016   Recurrent BCC (basal cell carcinoma) 03/31/2016   Personal history of cardiac arrhythmia 03/30/2016   Osteopenia 04/01/2015   Osteoarthritis 12/09/2014   Nocturia 12/09/2014   Diverticulosis of colon without hemorrhage 12/09/2014   H/O diverticulitis of colon 12/09/2014   Prediabetes 12/09/2014   Hyperlipidemia    Vitamin D deficiency    Macular hole of left eye     Social History   Tobacco Use   Smoking status: Never   Smokeless tobacco: Never  Substance Use Topics   Alcohol use: Yes  Alcohol/week: 0.0 standard drinks    Comment: 3-4 times a week, 1 drink   Current Outpatient Medications  Medication Instructions   acetaminophen (TYLENOL) 500 mg, Oral, Every 4 hours PRN   amoxicillin-clavulanate (AUGMENTIN) 875-125 MG tablet 1 tablet, Oral, 2 times daily, Take with food   BIOTIN PO Oral   Calcium Polycarbophil (FIBER-CAPS PO) Oral   Multiple Vitamin (MULTIVITAMIN) capsule 1 capsule, Oral, Daily   nystatin (MYCOSTATIN) 500,000 Units, Oral, 4 times daily, Swish and spit or gargle as needed   Probiotic Product (DIGESTIVE ADVANTAGE PO) Oral   VITAMIN D PO  4,000 Units, Oral   ZINC PICOLINATE PO Oral, As needed     No Known Allergies  Objective:   There were no vitals taken for this visit.  Patient is well-developed, well-nourished in no acute distress.  Resting comfortably at home.  Head is normocephalic, atraumatic.  No labored breathing.  Speech is clear and coherent with logical content.  Patient is alert and oriented at baseline.   Assessment and Plan:   Discussed with patient this is likely a sinus infection with drainage to chest. Plan to start her on an antibiotic that will cover sinusitis and any chest congestion as well.   Patient has a history of thrush with antibiotic use. Will send Nystatin oral rinse as needed to pharmacy as well.   Encouraged over the counter Mucinex use in combination with antibiotic to help loosen mucous and congestion.   Maintain hydration and seek follow up medical care for any new or acutely worsening symptoms as discussed.   Meds ordered this encounter  Medications   amoxicillin-clavulanate (AUGMENTIN) 875-125 MG tablet    Sig: Take 1 tablet by mouth 2 (two) times daily. Take with food    Dispense:  20 tablet    Refill:  0   nystatin (MYCOSTATIN) 100000 UNIT/ML suspension    Sig: Take 5 mLs (500,000 Units total) by mouth 4 (four) times daily for 14 days. Swish and spit or gargle as needed    Dispense:  60 mL    Refill:  0      Apolonio Schneiders, FNP 01/02/2021   I spent approximately 15 minutes on video consultation with patient today and coordinating her care.

## 2021-01-03 ENCOUNTER — Ambulatory Visit (HOSPITAL_BASED_OUTPATIENT_CLINIC_OR_DEPARTMENT_OTHER)
Admission: RE | Admit: 2021-01-03 | Discharge: 2021-01-03 | Disposition: A | Discharge status: Home / Self Care | Payer: Medicare HMO | Source: Ambulatory Visit | Attending: Family Medicine | Admitting: Family Medicine

## 2021-01-03 ENCOUNTER — Other Ambulatory Visit: Payer: Self-pay

## 2021-01-03 DIAGNOSIS — M8580 Other specified disorders of bone density and structure, unspecified site: Secondary | ICD-10-CM | POA: Insufficient documentation

## 2021-01-03 DIAGNOSIS — M810 Age-related osteoporosis without current pathological fracture: Secondary | ICD-10-CM | POA: Diagnosis not present

## 2021-01-03 DIAGNOSIS — M8589 Other specified disorders of bone density and structure, multiple sites: Secondary | ICD-10-CM | POA: Diagnosis not present

## 2021-01-03 DIAGNOSIS — Z780 Asymptomatic menopausal state: Secondary | ICD-10-CM | POA: Diagnosis not present

## 2021-01-05 ENCOUNTER — Encounter: Payer: Self-pay | Admitting: Family Medicine

## 2021-01-05 ENCOUNTER — Telehealth (INDEPENDENT_AMBULATORY_CARE_PROVIDER_SITE_OTHER): Payer: Medicare HMO | Admitting: Family Medicine

## 2021-01-05 ENCOUNTER — Telehealth: Payer: Self-pay | Admitting: *Deleted

## 2021-01-05 VITALS — Temp 98.8°F

## 2021-01-05 DIAGNOSIS — J0140 Acute pansinusitis, unspecified: Secondary | ICD-10-CM

## 2021-01-05 MED ORDER — DOXYCYCLINE HYCLATE 100 MG PO TABS: 100.0000 mg | ORAL_TABLET | Freq: Two times a day (BID) | ORAL | 0 refills | Status: AC

## 2021-01-05 MED ORDER — PREDNISONE 20 MG PO TABS: 40.0000 mg | ORAL_TABLET | Freq: Every day | ORAL | 0 refills | Status: AC

## 2021-01-05 NOTE — Telephone Encounter (Signed)
Catherine Prince left voicemail on triage stating she had an E-Visit with Apolonio Schneiders, FNP on 01/02/2021 and was diagnosed with sinusitis.  She was treated with Augmentin and was advised that if not better within 72 hours to contract her PCP.  She states she is actually feeling worse.  Please advise.

## 2021-01-05 NOTE — Telephone Encounter (Signed)
Can add on Virtual visit this morning if willing. Would probably recommend covid testing if this hasn't been done. But can also consider alternative abx while awaiting results.

## 2021-01-05 NOTE — Telephone Encounter (Signed)
Patient is scheduled for virtual at 1140, has had two negative covid tests within the last 48 hours.

## 2021-01-05 NOTE — Progress Notes (Signed)
    I connected with Tera Helper on 01/05/21 at 11:40 AM EDT by video and verified that I am speaking with the correct person using two identifiers.   I discussed the limitations, risks, security and privacy concerns of performing an evaluation and management service by video and the availability of in person appointments. I also discussed with the patient that there may be a patient responsible charge related to this service. The patient expressed understanding and agreed to proceed.  Patient location: Home Provider Location: [place] Participants: Catherine Prince and Tera Helper   Subjective:     Catherine Prince is a 75 y.o. female presenting for Cough (With tight chest ) and Sinusitis (X 2.5 weeks, worsening on 01/02/21)     Cough The cough is Productive of sputum. Associated symptoms include chest pain.  Sinusitis This is a new problem. The current episode started 1 to 4 weeks ago. The problem has been gradually worsening since onset. Associated symptoms include congestion, coughing and sneezing.    Took a Covid test on Monday 7/4 and another 7/7 which were both negative  Has been taking Augmentin bid since 7/4  Low grade fever    Review of Systems  HENT:  Positive for congestion and sneezing.   Respiratory:  Positive for cough.   Cardiovascular:  Positive for chest pain.    Social History   Tobacco Use  Smoking Status Never  Smokeless Tobacco Never        Objective:   BP Readings from Last 3 Encounters:  06/16/20 112/78  04/18/20 110/78  03/03/20 106/64   Wt Readings from Last 3 Encounters:  06/16/20 138 lb 12.8 oz (63 kg)  04/18/20 137 lb 12 oz (62.5 kg)  03/03/20 135 lb (61.2 kg)    Temp 98.8 F (37.1 C) (Oral)   SpO2 96%   Physical Exam Constitutional:      Appearance: Normal appearance. She is not ill-appearing.  HENT:     Head: Normocephalic and atraumatic.     Right Ear: External ear normal.     Left Ear: External ear  normal.  Eyes:     Conjunctiva/sclera: Conjunctivae normal.  Pulmonary:     Effort: Pulmonary effort is normal. No respiratory distress.  Neurological:     Mental Status: She is alert. Mental status is at baseline.  Psychiatric:        Mood and Affect: Mood normal.        Behavior: Behavior normal.        Thought Content: Thought content normal.        Judgment: Judgment normal.         Assessment & Plan:   Problem List Items Addressed This Visit   None Visit Diagnoses     Acute non-recurrent pansinusitis    -  Primary   Relevant Medications   doxycycline (VIBRA-TABS) 100 MG tablet   predniSONE (DELTASONE) 20 MG tablet      Discussed could be either resistant sinus infection vs pneumonia  Will add Doxycycline coverage - continue symptomatic care If no or minimal improvement in 1-2 days - prednisone x 5 days If worsening - ER or urgent care  If no resolution in 7 days in-person visit   Return if symptoms worsen or fail to improve.  Catherine Noe, MD

## 2021-01-05 NOTE — Patient Instructions (Signed)
Based on your symptoms, it looks like you have a virus.   Antibiotics are not need for a viral infection but the following will help:   Drink plenty of fluids Get lots of rest  Sinus Congestion 1) Neti Pot (Saline rinse) -- 2 times day -- if tolerated 2) Flonase (Store Brand ok) - once daily 3) Over the counter congestion medications  Cough 1) Cough drops can be helpful 2) Nyquil (or nighttime cough medication) 3) Honey is proven to be one of the best cough medications  4) Cough medicine with Dextromethorphan can also be helpful   If you develop fevers (Temperature >100.4), chills, worsening symptoms or symptoms lasting longer than 10 days return to clinic.

## 2021-01-16 ENCOUNTER — Encounter: Payer: Self-pay | Admitting: Family Medicine

## 2021-01-17 ENCOUNTER — Encounter: Payer: Self-pay | Admitting: Family Medicine

## 2021-01-17 ENCOUNTER — Ambulatory Visit (INDEPENDENT_AMBULATORY_CARE_PROVIDER_SITE_OTHER): Payer: Medicare HMO | Admitting: Family Medicine

## 2021-01-17 ENCOUNTER — Other Ambulatory Visit: Payer: Self-pay

## 2021-01-17 VITALS — BP 124/82 | HR 84 | Temp 98.2°F | Ht 61.0 in | Wt 135.0 lb

## 2021-01-17 DIAGNOSIS — J069 Acute upper respiratory infection, unspecified: Secondary | ICD-10-CM

## 2021-01-17 NOTE — Patient Instructions (Addendum)
   Sinus Congestion 1) Neti Pot (Saline rinse) -- 2 times day -- if tolerated 2) Flonase (Store Brand ok) - once daily 3) daily allergy (store brand) - allegra, claritin, zyrtec   Cough 1) Cough drops can be helpful 2) Nyquil (or nighttime cough medication) 3) Honey is proven to be one of the best cough medications  4) Cough medicine with Dextromethorphan can also be helpful  If cough not improved within 2 weeks -- send mychart message if this is the case and I will send for Chest X-ray

## 2021-01-17 NOTE — Progress Notes (Signed)
Subjective:     Catherine Prince is a 75 y.o. female presenting for Cough and chest congestion     HPI  # Cough/Congestion - went to the beach and returned with sinus symptoms - middle of June - 01/02/2021 - treated for sinus infection with Augmentin - 01/05/2021 - more chest congestion and cough, limited improvement with the sinus symptoms  - add doxycycline and prednisone - No improvement with medication - about a week after medication - cough has improved, nasal congestion has improved, no face pain  Had negative covid test x 2 Still feels she may be sick Still low energy Occasional cough, congestion - though improved   Review of Systems   Social History   Tobacco Use  Smoking Status Never  Smokeless Tobacco Never        Objective:    BP Readings from Last 3 Encounters:  01/17/21 124/82  06/16/20 112/78  04/18/20 110/78   Wt Readings from Last 3 Encounters:  01/17/21 135 lb (61.2 kg)  06/16/20 138 lb 12.8 oz (63 kg)  04/18/20 137 lb 12 oz (62.5 kg)    BP 124/82   Pulse 84   Temp 98.2 F (36.8 C) (Temporal)   Ht 5\' 1"  (1.549 m)   Wt 135 lb (61.2 kg)   SpO2 98%   BMI 25.51 kg/m    Physical Exam Constitutional:      General: She is not in acute distress.    Appearance: She is well-developed. She is not diaphoretic.  HENT:     Head: Normocephalic and atraumatic.     Right Ear: Tympanic membrane and ear canal normal.     Left Ear: Tympanic membrane and ear canal normal.     Nose: Mucosal edema and rhinorrhea present.     Right Sinus: No maxillary sinus tenderness or frontal sinus tenderness.     Left Sinus: No maxillary sinus tenderness or frontal sinus tenderness.     Mouth/Throat:     Pharynx: Uvula midline. Posterior oropharyngeal erythema present. No oropharyngeal exudate.     Tonsils: 0 on the right. 0 on the left.  Eyes:     General: No scleral icterus.    Conjunctiva/sclera: Conjunctivae normal.  Cardiovascular:     Rate and Rhythm:  Normal rate and regular rhythm.     Heart sounds: Normal heart sounds. No murmur heard. Pulmonary:     Effort: Pulmonary effort is normal. No respiratory distress.     Breath sounds: Normal breath sounds.  Musculoskeletal:     Cervical back: Neck supple.  Lymphadenopathy:     Cervical: No cervical adenopathy.  Skin:    General: Skin is warm and dry.     Capillary Refill: Capillary refill takes less than 2 seconds.  Neurological:     Mental Status: She is alert.          Assessment & Plan:   Problem List Items Addressed This Visit   None Visit Diagnoses     Viral URI with cough    -  Primary      Discussed the cough can linger for 6 weeks. Lungs clear and symptoms improving.   Advised trial of allergy medications for possible allergy component No need for imaging or additional abx.   If cough not resolved in 6 full weeks will get CXR  Return if symptoms worsen or fail to improve.  [name], MD  This visit occurred during the SARS-CoV-2 public health emergency.  Safety protocols  were in place, including screening questions prior to the visit, additional usage of staff PPE, and extensive cleaning of exam room while observing appropriate contact time as indicated for disinfecting solutions.

## 2021-01-23 ENCOUNTER — Ambulatory Visit
Admission: RE | Admit: 2021-01-23 | Discharge: 2021-01-23 | Disposition: A | Discharge status: Home / Self Care | Payer: Medicare HMO | Source: Ambulatory Visit | Attending: Family Medicine | Admitting: Family Medicine

## 2021-01-23 ENCOUNTER — Other Ambulatory Visit: Payer: Self-pay

## 2021-01-23 DIAGNOSIS — Z1231 Encounter for screening mammogram for malignant neoplasm of breast: Secondary | ICD-10-CM | POA: Diagnosis not present

## 2021-03-24 IMAGING — MG DIGITAL SCREENING BILATERAL MAMMOGRAM WITH TOMO AND CAD
8 series · 9 of 24 positions shown · non-contrast
Comparison: Previous exam(s).

CLINICAL DATA: Screening.

EXAM:
DIGITAL SCREENING BILATERAL MAMMOGRAM WITH TOMO AND CAD

[L MLO synth-2D]
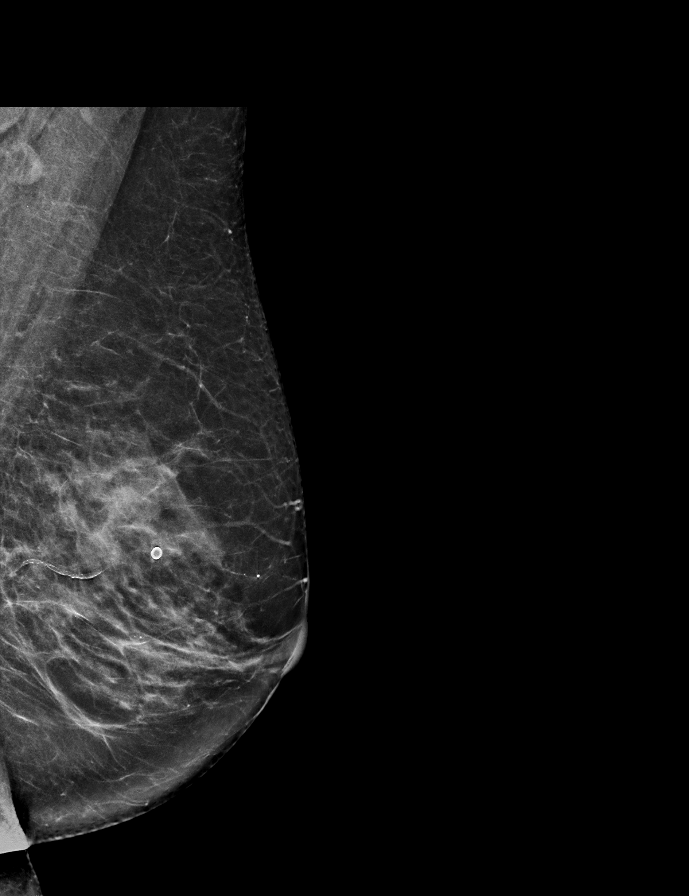

[R MLO synth-2D]
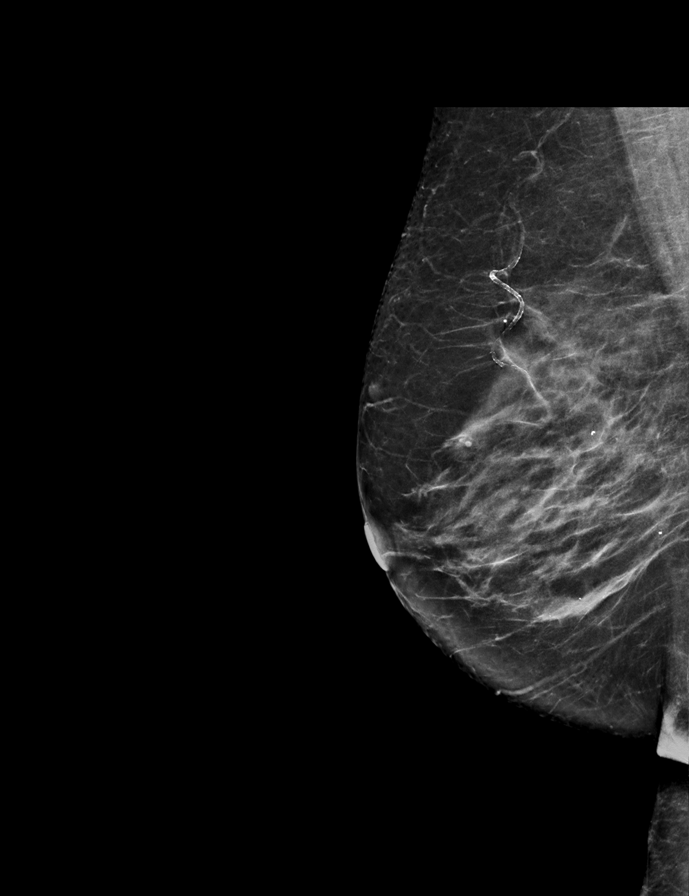

[R CC synth-2D]
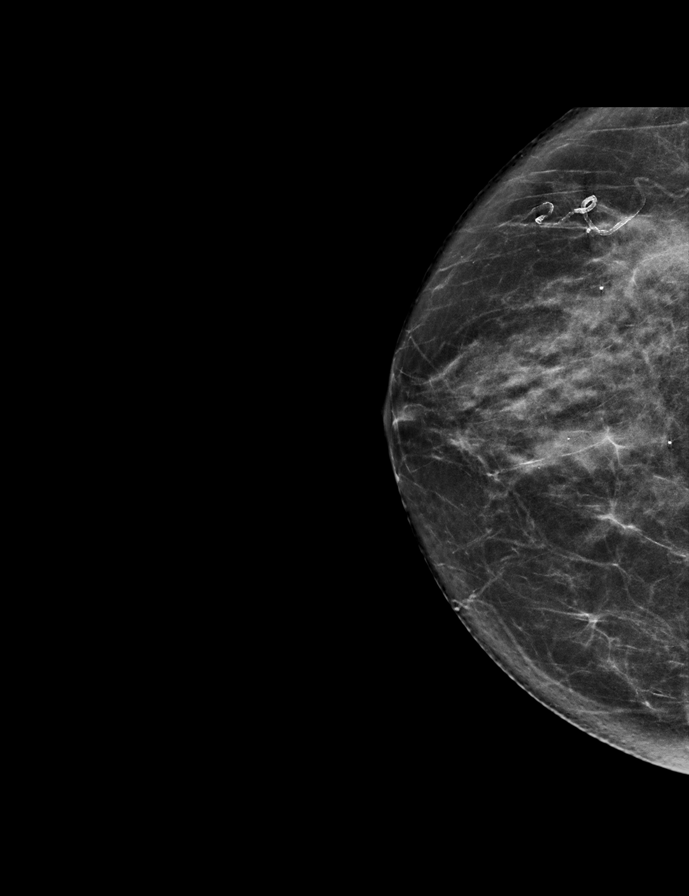

[L CC synth-2D]
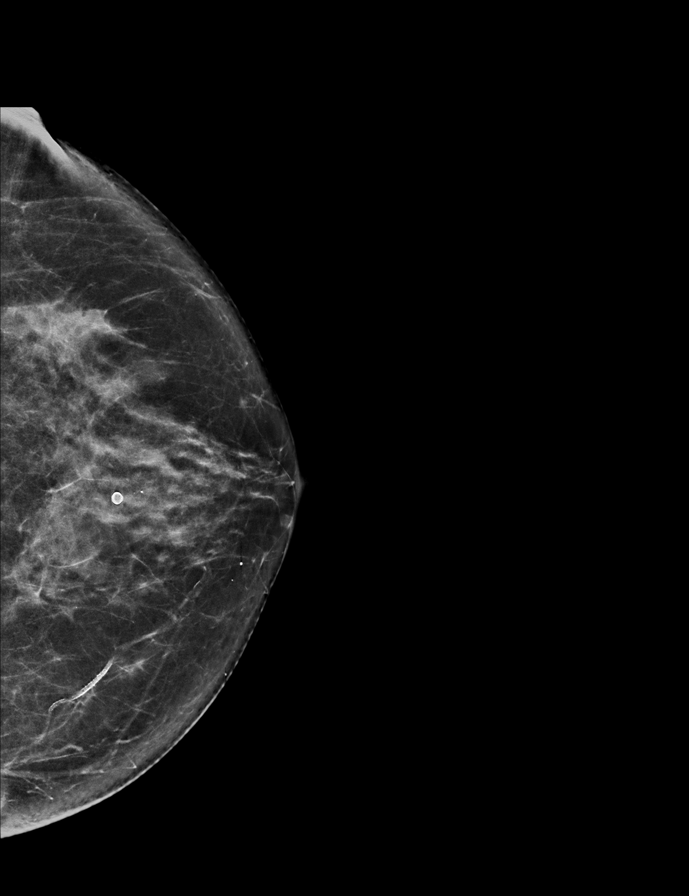

[R CC tomo · 2 of 63 frames shown]
[frame 21/63]
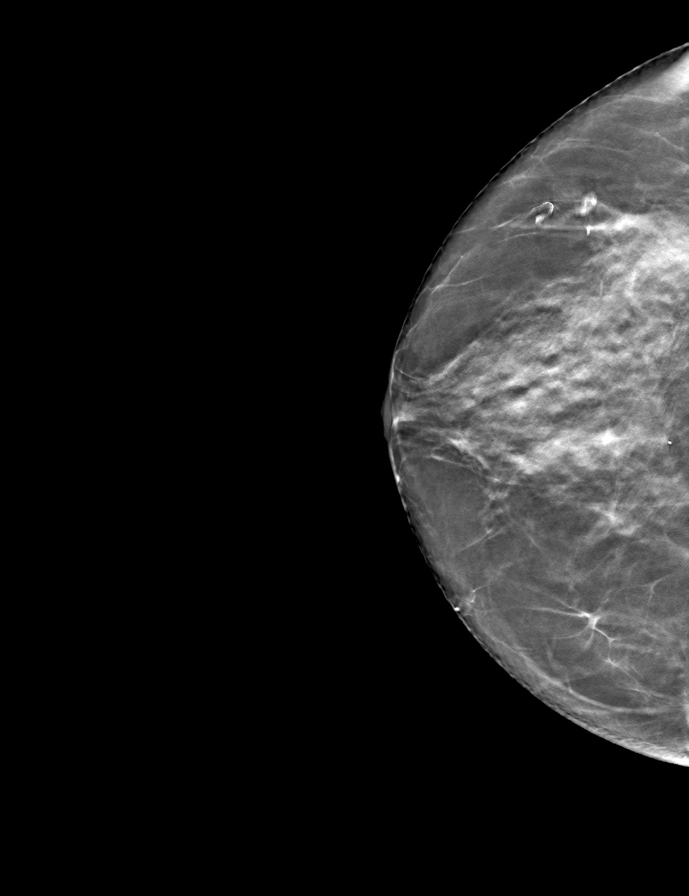
[frame 32/63]
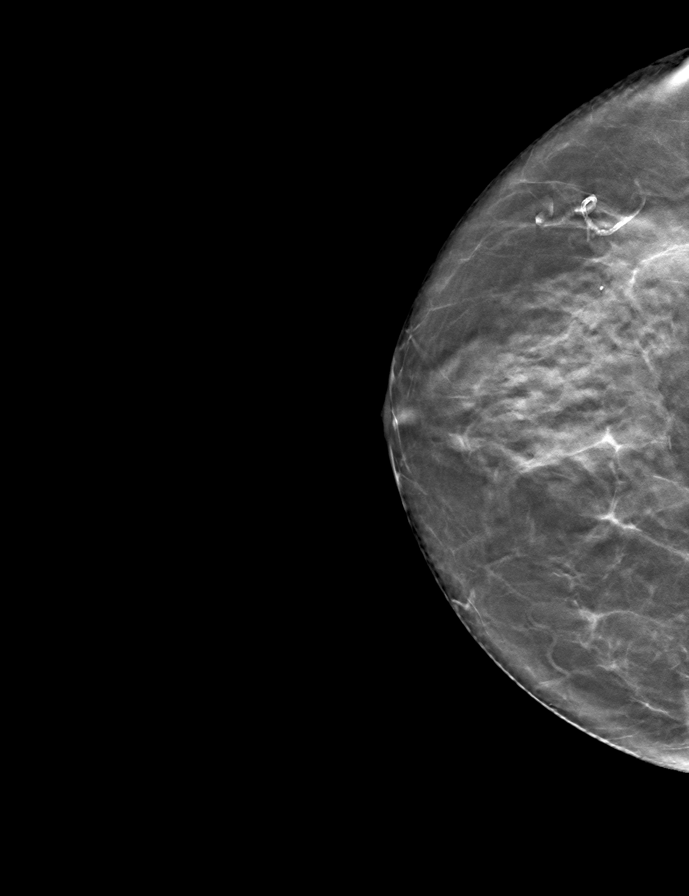

[R MLO tomo · tomo slice 37/72.0]
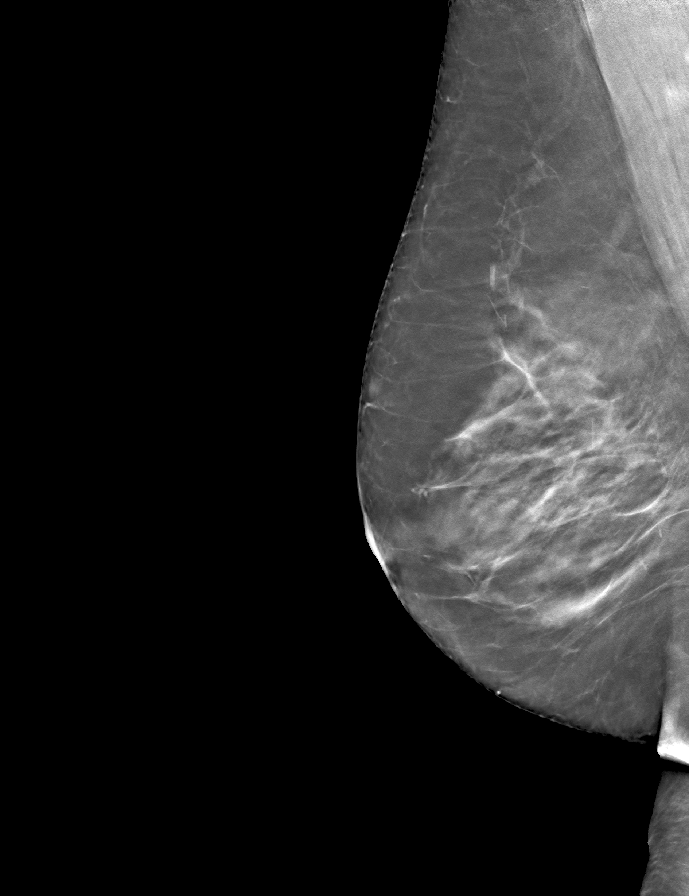

[L CC tomo · tomo slice 33/65.0]
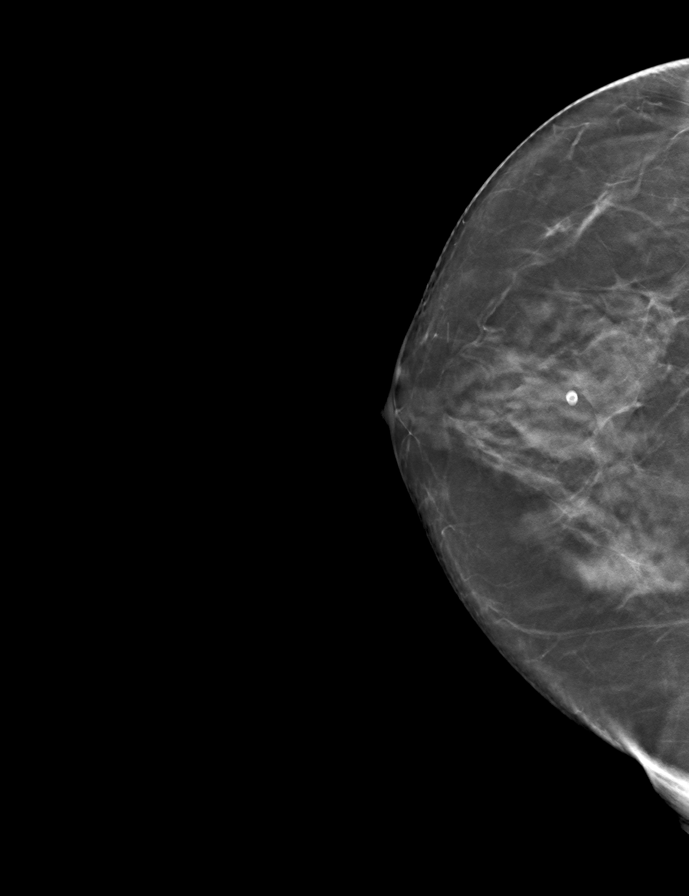

[L MLO tomo · tomo slice 35/68.0]
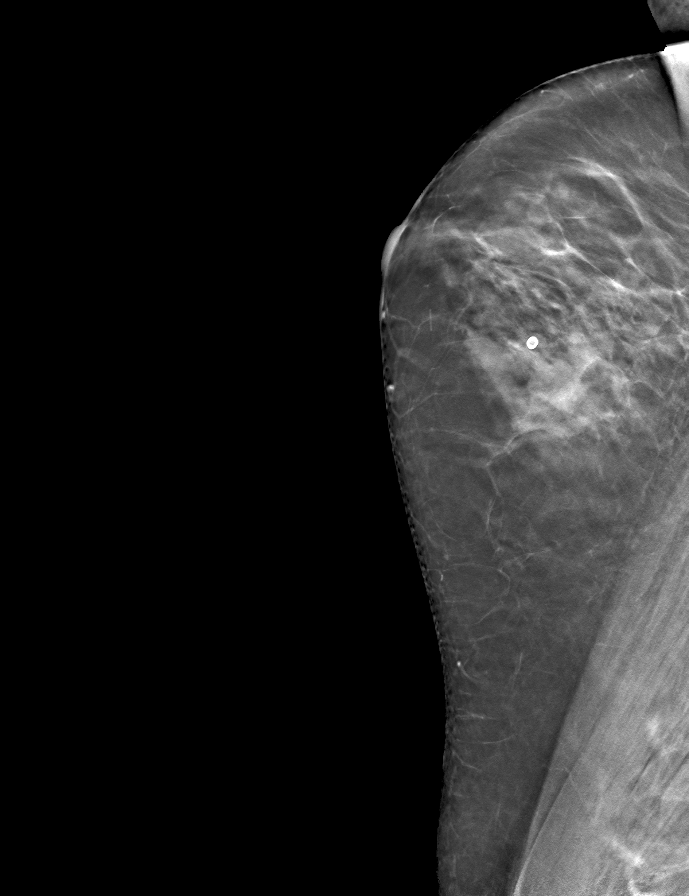

[9 of 24 positions shown; findings below may reference images not displayed]

ACR Breast Density Category c: The breast tissue is heterogeneously
dense, which may obscure small masses.
FINDINGS: There are no findings suspicious for malignancy. Images were
processed with CAD.
IMPRESSION: No mammographic evidence of malignancy. A result letter of this
screening mammogram will be mailed directly to the patient.

RECOMMENDATION:
Screening mammogram in one year. (Code:FT-U-LHB)

BI-RADS CATEGORY  1: Negative.

## 2021-05-05 DIAGNOSIS — J342 Deviated nasal septum: Secondary | ICD-10-CM | POA: Diagnosis not present

## 2021-05-05 DIAGNOSIS — H903 Sensorineural hearing loss, bilateral: Secondary | ICD-10-CM | POA: Diagnosis not present

## 2021-06-05 NOTE — Progress Notes (Signed)
Subjective:   Catherine Prince is a 75 y.o. female who presents for Medicare Annual (Subsequent) preventive examination.  I connected with Catherine Prince today by telephone and verified that I am speaking with the correct person using two identifiers. Location patient: home Location provider: work Persons participating in the virtual visit: patient, Marine scientist.    I discussed the limitations, risks, security and privacy concerns of performing an evaluation and management service by telephone and the availability of in person appointments. I also discussed with the patient that there may be a patient responsible charge related to this service. The patient expressed understanding and verbally consented to this telephonic visit.    Interactive audio and video telecommunications were attempted between this provider and patient, however failed, due to patient having technical difficulties OR patient did not have access to video capability.  We continued and completed visit with audio only.  Some vital signs may be absent or patient reported.   Time Spent with patient on telephone encounter: 20 minutes  Review of Systems     Cardiac Risk Factors include: advanced age (>60men, >28 women);dyslipidemia     Objective:    Today's Vitals   06/07/21 0945  Weight: 133 lb (60.3 kg)  Height: 5\' 1"  (1.549 m)   Body mass index is 25.13 kg/m.  Advanced Directives 06/07/2021 06/03/2020 12/10/2019 12/09/2019 10/21/2018 01/05/2018 01/04/2018  Does Patient Have a Medical Advance Directive? Yes Yes - No Yes Yes Yes  Type of Paramedic of [place];Living will [place];Living will - - [place];Living will - Living will  Does patient want to make changes to medical advance directive? Yes (MAU/Ambulatory/Procedural Areas - Information given) - - - No - Patient declined - No - Patient declined  Copy of [place] in Chart? - No - copy  requested - - Yes - validated most recent copy scanned in chart (See row information) - No - copy requested  Would patient like information on creating a medical advance directive? - - No - Patient declined - - - -    Current Medications (verified) Outpatient Encounter Medications as of 06/07/2021  Medication Sig   acetaminophen (TYLENOL) 500 MG tablet Take 500 mg by mouth every 4 (four) hours as needed for mild pain.    BIOTIN PO Take by mouth.   Calcium Polycarbophil (FIBER-CAPS PO) Take by mouth.   Multiple Vitamin (MULTIVITAMIN) capsule Take 1 capsule by mouth daily.   Probiotic Product (DIGESTIVE ADVANTAGE PO) Take by mouth.   VITAMIN D PO Take 4,000 Units by mouth.   ZINC PICOLINATE PO Take by mouth as needed.   No facility-administered encounter medications on file as of 06/07/2021.    Allergies (verified) Patient has no known allergies.   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   Cataract    Left eye   Diverticulitis    Diverticulosis of colon without hemorrhage 12/09/2014   H/O diverticulitis of colon 12/09/2014   7.5 inches removed after rupture in 2010, has done well since Also rook appendix and 1 ovary   Heart murmur    History of chicken pox    Impaired fasting glucose 12/09/2014   Macular hole of left eye    Measles    Mumps    Nocturia 12/09/2014   Osteoarthritis 12/09/2014   Osteopenia 04/01/2015  Personal history of cardiac arrhythmia 03/30/2016   SBO (small bowel obstruction) ([place]) 01/04/2018   Vitamin D deficiency    Past Surgical History:  Procedure Laterality Date   APPENDECTOMY     BLEPHAROPLASTY Bilateral 08/02/2017   BREAST BIOPSY     BREAST CYST ASPIRATION     CATARACT EXTRACTION Left    COLON SURGERY  2010   pt. reports intestines burst   COLONOSCOPY  03/03/2020   EYE SURGERY     Left eye, retinal hole, Dr Manson Allan, at [facility] in [place] Left    09/2013   REPLACEMENT TOTAL KNEE Right 2016   RIGHT OOPHORECTOMY Right    during Diverticular surgery   Family History  Problem Relation Age of Onset   Thyroid disease Mother    Diabetes Mother        developed in her 68's   Dementia Mother        mini strokes   Heart disease Father 31   Thyroid disease Sister    Heart disease Maternal Grandmother    Prostate cancer Maternal Grandfather    Heart disease Paternal Grandmother    Heart disease Paternal Grandfather    Colon cancer Neg Hx    Esophageal cancer Neg Hx    Stomach cancer Neg Hx    Pancreatic cancer Neg Hx    Rectal cancer Neg Hx    Breast cancer Neg Hx    Social History   Socioeconomic History   Marital status: Married    Spouse name: JD   Number of children: 1   Years of education: college   Highest education level: Not on file  Occupational History   Occupation: Web designer at [employer]   Smoking status: Never   Smokeless tobacco: Never  Vaping Use   Vaping Use: Never used  Substance and Sexual Activity   Alcohol use: Yes    Alcohol/week: 0.0 standard drinks    Comment: 3-4 times a week, 1 drink   Drug use: No   Sexual activity: Yes    Birth control/protection: Post-menopausal  Other Topics Concern   Not on file  Social History Narrative   04/18/20   From: [place] originally, moved to the [employer] in middle school   Living: with [place], husband (1982)    Work: semi-retired - Human resources officer spanish       Family: Cecilie Lowers - in [place] - 2 grandchildren and step son Saralyn Pilar and 3 step grandchildren in [place]      Enjoys: active with her church - serves on sundays, bible studies, yard work, reading      Exercise: going to [employer], exercising with virtual class   Diet: tries to eat healthy, drink more water      Safety   Seat belts: Yes    Guns: Yes  and secure   Safe in relationships: Yes    Social Determinants of Radio broadcast assistant Strain:  Low Risk    Difficulty of Paying Living Expenses: Not hard at all  Food Insecurity: No Food Insecurity   Worried About Charity fundraiser in the Last Year: Never true   Arboriculturist in the Last Year: Never true  Transportation Needs: No Transportation Needs   Lack of Transportation (Medical): No   Lack of Transportation (Non-Medical): No  Physical Activity: Inactive   Days of Exercise per Week: 0 days   Minutes of Exercise per Session: 0  min  Stress: No Stress Concern Present   Feeling of Stress : Not at all  Social Connections: Socially Integrated   Frequency of Communication with Friends and Family: Twice a week   Frequency of Social Gatherings with Friends and Family: More than three times a week   Attends Religious Services: More than 4 times per year   Active Member of [employer] or Organizations: Yes   Attends Music therapist: More than 4 times per year   Marital Status: Married    Tobacco Counseling Counseling given: Not Answered   Clinical Intake:  Pre-visit preparation completed: Yes  Pain : No/denies pain     BMI - recorded: 25.13 Nutritional Status: BMI 25 -29 Overweight Nutritional Risks: None Diabetes: No  How often do you need to have someone help you when you read instructions, pamphlets, or other written materials from your doctor or pharmacy?: 1 - Never  Diabetic?No  Interpreter Needed?: No  Information entered by :: Orrin Brigham LPN   Activities of Daily Living In your present state of health, do you have any difficulty performing the following activities: 06/07/2021  Hearing? Y  Comment mild hearing loss  Vision? N  Difficulty concentrating or making decisions? N  Walking or climbing stairs? N  Dressing or bathing? N  Doing errands, shopping? N  Preparing Food and eating ? N  Using the Toilet? N  In the past six months, have you accidently leaked urine? N  Do you have problems with loss of bowel control? N  Managing your  Medications? N  Managing your Finances? N  Housekeeping or managing your Housekeeping? N  Some recent data might be hidden    Patient Care Team: [name], MD as PCP - General (Family Medicine) Nicole Kindred, MD as Referring Physician (Orthopedic Surgery)  Indicate any recent Medical Services you may have received from other than [facility] providers in the past year (date may be approximate).     Assessment:   This is a routine wellness examination for [place].  Hearing/Vision screen Hearing Screening - Comments:: Patient states mild hearing loss per ENT  Vision Screening - Comments:: Last exam -2021, patient has upcoming appointment, Dr. Nicki Reaper  Dietary issues and exercise activities discussed: Current Exercise Habits: The patient does not participate in regular exercise at present   Goals Addressed             This Visit's Progress    Patient Stated       Would like to drink more water and have an exercise routine       Depression Screen PHQ 2/9 Scores 06/07/2021 06/03/2020 04/18/2020 12/16/2019 10/21/2018 10/17/2017 10/12/2016  PHQ - 2 Score 0 0 0 0 0 0 0  PHQ- 9 Score - 0 - - - - -    Fall Risk Fall Risk  06/07/2021 06/16/2020 06/03/2020 04/18/2020 12/16/2019  Falls in the past year? 0 1 1 1  0  Comment - walking on a trail with dog fell while walking on the trial - -  Number falls in past yr: 0 0 0 0 0  Comment - - - - -  Injury with Fall? 0 0 0 0 0  Risk for fall due to : No Fall Risks - No Fall Risks - -  Follow up Falls prevention discussed - Falls evaluation completed;Falls prevention discussed - Falls evaluation completed    FALL RISK PREVENTION PERTAINING TO THE HOME:  Any stairs in or around the home? Yes  If so, are there any without handrails? No  Home free of loose throw rugs in walkways, pet beds, electrical cords, etc? Yes  Adequate lighting in your home to reduce risk of falls? Yes   ASSISTIVE DEVICES UTILIZED TO PREVENT FALLS:  Life alert? No  Use  of a cane, walker or w/c? No  Grab bars in the bathroom? No  Shower chair or bench in shower? Yes  Elevated toilet seat or a handicapped toilet? Yes   TIMED UP AND GO:  Was the test performed? No , visit completed over the phone.   Cognitive Function: Normal cognitive status assessed by this Nurse Health Advisor. No abnormalities found.   MMSE - Mini Mental State Exam 06/03/2020 03/25/2015  Orientation to time 5 5  Orientation to Place 5 5  Registration 3 3  Attention/ Calculation 5 5  Recall 3 3  Language- name 2 objects - 2  Language- repeat 1 1  Language- follow 3 step command - 3  Language- read & follow direction - 1  Write a sentence - 1  Copy design - 1  Total score - 30        Immunizations Immunization History  Administered Date(s) Administered   Fluad Quad(high Dose 65+) 04/18/2020   Influenza, High Dose Seasonal PF 03/25/2015, 03/30/2016   Influenza-Unspecified 04/02/2019   Pneumococcal Conjugate-13 02/26/2014   Pneumococcal Polysaccharide-23 03/30/2016   Tdap 02/26/2014   Zoster, Live 08/27/2011    TDAP status: Up to date  Flu Vaccine status: Due, Education has been provided regarding the importance of this vaccine. Advised may receive this vaccine at local pharmacy or Health Dept. Aware to provide a copy of the vaccination record if obtained from local pharmacy or Health Dept. Verbalized acceptance and understanding.  Pneumococcal vaccine status: Up to date  Covid-19 vaccine status: Information provided on how to obtain vaccines.   Qualifies for Shingles Vaccine? Yes   Zostavax completed Yes   Shingrix Completed?: No.    Education has been provided regarding the importance of this vaccine. Patient has been advised to call insurance company to determine out of pocket expense if they have not yet received this vaccine. Advised may also receive vaccine at local pharmacy or Health Dept. Verbalized acceptance and understanding.  Screening Tests Health  Maintenance  Topic Date Due   COVID-19 Vaccine (1) Never done   Zoster Vaccines- Shingrix (1 of 2) Never done   INFLUENZA VACCINE  01/30/2021   MAMMOGRAM  01/24/2023   TETANUS/TDAP  02/27/2024   COLONOSCOPY (Pts 45-89yrs Insurance coverage will need to be confirmed)  03/03/2025   Pneumonia Vaccine 67+ Years old  Completed   DEXA SCAN  Completed   Hepatitis C Screening  Completed   HPV VACCINES  Aged Out    Health Maintenance  Health Maintenance Due  Topic Date Due   COVID-19 Vaccine (1) Never done   Zoster Vaccines- Shingrix (1 of 2) Never done   INFLUENZA VACCINE  01/30/2021    Colorectal cancer screening: Type of screening: Colonoscopy. Completed 03/03/20. Repeat every 5 years  Mammogram status: Completed 01/23/21. Repeat every year  Bone Density status: Completed 01/23/21. Results reflect: Bone density results: OSTEOPENIA. Repeat every 2 years.  Lung Cancer Screening: (Low Dose CT Chest recommended if Age 27-80 years, 30 pack-year currently smoking OR have quit w/in 15years.) does qualify.     Additional Screening:  Hepatitis C Screening: does qualify; Completed 10/12/16  Vision Screening: Recommended annual ophthalmology exams for early detection of glaucoma and  other disorders of the eye. Is the patient up to date with their annual eye exam?  Yes  Who is the provider or what is the name of the office in which the patient attends annual eye exams? Dr. Nicki Reaper   Dental Screening: Recommended annual dental exams for proper oral hygiene  Community Resource Referral / Chronic Care Management: CRR required this visit?  No   CCM required this visit?  No      Plan:     I have personally reviewed and noted the following in the patient's chart:   Medical and social history Use of alcohol, tobacco or illicit drugs  Current medications and supplements including opioid prescriptions.  Functional ability and status Nutritional status Physical activity Advanced  directives List of other physicians Hospitalizations, surgeries, and ER visits in previous 12 months Vitals Screenings to include cognitive, depression, and falls Referrals and appointments  In addition, I have reviewed and discussed with patient certain preventive protocols, quality metrics, and best practice recommendations. A written personalized care plan for preventive services as well as general preventive health recommendations were provided to patient.   Due to this being a telephonic visit, the after visit summary with patients personalized plan was offered to patient via mail or my-chart. Patient would like to access on my-chart.    Loma Messing, LPN   28/12/8674   Nurse Health Advisor  Nurse Notes: none

## 2021-06-07 ENCOUNTER — Ambulatory Visit (INDEPENDENT_AMBULATORY_CARE_PROVIDER_SITE_OTHER): Payer: Medicare HMO

## 2021-06-07 VITALS — Ht 61.0 in | Wt 133.0 lb

## 2021-06-07 DIAGNOSIS — Z0000 Encounter for general adult medical examination without abnormal findings: Secondary | ICD-10-CM | POA: Diagnosis not present

## 2021-06-07 NOTE — Patient Instructions (Signed)
Catherine Prince , Thank you for taking time to complete your Medicare Wellness Visit. I appreciate your ongoing commitment to your health goals. Please review the following plan we discussed and let me know if I can assist you in the future.   Screening recommendations/referrals: Colonoscopy: up to date, completed 03/03/20, due 03/03/25 Mammogram: up to date , completed 01/23/21, due 01/23/22 Bone Density: up to date, completed 01/03/21, due 01/03/22 Recommended yearly ophthalmology/optometry visit for glaucoma screening and checkup Recommended yearly dental visit for hygiene and checkup  Vaccinations: Influenza vaccine: due, Discuss with your local pharmacy Pneumococcal vaccine: up to date Tdap vaccine: up to date, completed 02/26/14, due 02/27/24  Shingles vaccine: Discuss with  with your local pharmacy  Covid-19: newest booster available at your local pharmacy  Advanced directives: Please bring a copy of Living Will and/or [place] for your chart.   Conditions/risks identified: see problem list  Next appointment: Follow up in one year for your annual wellness visit    Preventive Care 65 Years and Older, Female Preventive care refers to lifestyle choices and visits with your health care provider that can promote health and wellness. What does preventive care include? A yearly physical exam. This is also called an annual well check. Dental exams once or twice a year. Routine eye exams. Ask your health care provider how often you should have your eyes checked. Personal lifestyle choices, including: Daily care of your teeth and gums. Regular physical activity. Eating a healthy diet. Avoiding tobacco and drug use. Limiting alcohol use. Practicing safe sex. Taking low-dose aspirin every day. Taking vitamin and mineral supplements as recommended by your health care provider. What happens during an annual well check? The services and screenings done by your health care  provider during your annual well check will depend on your age, overall health, lifestyle risk factors, and family history of disease. Counseling  Your health care provider may ask you questions about your: Alcohol use. Tobacco use. Drug use. Emotional well-being. Home and relationship well-being. Sexual activity. Eating habits. History of falls. Memory and ability to understand (cognition). Work and work Statistician. Reproductive health. Screening  You may have the following tests or measurements: Height, weight, and BMI. Blood pressure. Lipid and cholesterol levels. These may be checked every 5 years, or more frequently if you are over 74 years old. Skin check. Lung cancer screening. You may have this screening every year starting at age 74 if you have a 30-pack-year history of smoking and currently smoke or have quit within the past 15 years. Fecal occult blood test (FOBT) of the stool. You may have this test every year starting at age 74. Flexible sigmoidoscopy or colonoscopy. You may have a sigmoidoscopy every 5 years or a colonoscopy every 10 years starting at age 68. Hepatitis C blood test. Hepatitis B blood test. Sexually transmitted disease (STD) testing. Diabetes screening. This is done by checking your blood sugar (glucose) after you have not eaten for a while (fasting). You may have this done every 1-3 years. Bone density scan. This is done to screen for osteoporosis. You may have this done starting at age 92. Mammogram. This may be done every 1-2 years. Talk to your health care provider about how often you should have regular mammograms. Talk with your health care provider about your test results, treatment options, and if necessary, the need for more tests. Vaccines  Your health care provider may recommend certain vaccines, such as: Influenza vaccine. This is recommended every year.  Tetanus, diphtheria, and acellular pertussis (Tdap, Td) vaccine. You may need a Td  booster every 10 years. Zoster vaccine. You may need this after age 25. Pneumococcal 13-valent conjugate (PCV13) vaccine. One dose is recommended after age 33. Pneumococcal polysaccharide (PPSV23) vaccine. One dose is recommended after age 21. Talk to your health care provider about which screenings and vaccines you need and how often you need them. This information is not intended to replace advice given to you by your health care provider. Make sure you discuss any questions you have with your health care provider. Document Released: 07/15/2015 Document Revised: 03/07/2016 Document Reviewed: 04/19/2015 Elsevier Interactive Patient Education  2017 [place] Prevention in the Home Falls can cause injuries. They can happen to people of all ages. There are many things you can do to make your home safe and to help prevent falls. What can I do on the outside of my home? Regularly fix the edges of walkways and driveways and fix any cracks. Remove anything that might make you trip as you walk through a door, such as a raised step or threshold. Trim any bushes or trees on the path to your home. Use bright outdoor lighting. Clear any walking paths of anything that might make someone trip, such as rocks or tools. Regularly check to see if handrails are loose or broken. Make sure that both sides of any steps have handrails. Any raised decks and porches should have guardrails on the edges. Have any leaves, snow, or ice cleared regularly. Use sand or salt on walking paths during winter. Clean up any spills in your garage right away. This includes oil or grease spills. What can I do in the bathroom? Use night lights. Install grab bars by the toilet and in the tub and shower. Do not use towel bars as grab bars. Use non-skid mats or decals in the tub or shower. If you need to sit down in the shower, use a plastic, non-slip stool. Keep the floor dry. Clean up any water that spills on the floor  as soon as it happens. Remove soap buildup in the tub or shower regularly. Attach bath mats securely with double-sided non-slip rug tape. Do not have throw rugs and other things on the floor that can make you trip. What can I do in the bedroom? Use night lights. Make sure that you have a light by your bed that is easy to reach. Do not use any sheets or blankets that are too big for your bed. They should not hang down onto the floor. Have a firm chair that has side arms. You can use this for support while you get dressed. Do not have throw rugs and other things on the floor that can make you trip. What can I do in the kitchen? Clean up any spills right away. Avoid walking on wet floors. Keep items that you use a lot in easy-to-reach places. If you need to reach something above you, use a strong step stool that has a grab bar. Keep electrical cords out of the way. Do not use floor polish or wax that makes floors slippery. If you must use wax, use non-skid floor wax. Do not have throw rugs and other things on the floor that can make you trip. What can I do with my stairs? Do not leave any items on the stairs. Make sure that there are handrails on both sides of the stairs and use them. Fix handrails that are broken or loose.  Make sure that handrails are as long as the stairways. Check any carpeting to make sure that it is firmly attached to the stairs. Fix any carpet that is loose or worn. Avoid having throw rugs at the top or bottom of the stairs. If you do have throw rugs, attach them to the floor with carpet tape. Make sure that you have a light switch at the top of the stairs and the bottom of the stairs. If you do not have them, ask someone to add them for you. What else can I do to help prevent falls? Wear shoes that: Do not have high heels. Have rubber bottoms. Are comfortable and fit you well. Are closed at the toe. Do not wear sandals. If you use a stepladder: Make sure that it is  fully opened. Do not climb a closed stepladder. Make sure that both sides of the stepladder are locked into place. Ask someone to hold it for you, if possible. Clearly mark and make sure that you can see: Any grab bars or handrails. First and last steps. Where the edge of each step is. Use tools that help you move around (mobility aids) if they are needed. These include: Canes. Walkers. Scooters. Crutches. Turn on the lights when you go into a dark area. Replace any light bulbs as soon as they burn out. Set up your furniture so you have a clear path. Avoid moving your furniture around. If any of your floors are uneven, fix them. If there are any pets around you, be aware of where they are. Review your medicines with your doctor. Some medicines can make you feel dizzy. This can increase your chance of falling. Ask your doctor what other things that you can do to help prevent falls. This information is not intended to replace advice given to you by your health care provider. Make sure you discuss any questions you have with your health care provider. Document Released: 04/14/2009 Document Revised: 11/24/2015 Document Reviewed: 07/23/2014 Elsevier Interactive Patient Education  2017 [employer].
